# Patient Record
Sex: Female | Born: 1945 | Race: White | Hispanic: No | Marital: Married | Smoking: Former smoker
Health system: Southern US, Community
[De-identification: ages and names within clinical notes are randomized; demographics above are authoritative.]

## PROBLEM LIST (undated history)

## (undated) DIAGNOSIS — I639 Cerebral infarction, unspecified: Secondary | ICD-10-CM

## (undated) DIAGNOSIS — D1800 Hemangioma unspecified site: Secondary | ICD-10-CM

## (undated) DIAGNOSIS — Z87898 Personal history of other specified conditions: Secondary | ICD-10-CM

## (undated) DIAGNOSIS — Z5189 Encounter for other specified aftercare: Secondary | ICD-10-CM

## (undated) DIAGNOSIS — M549 Dorsalgia, unspecified: Secondary | ICD-10-CM

## (undated) DIAGNOSIS — G20 Parkinson's disease: Secondary | ICD-10-CM

## (undated) DIAGNOSIS — R413 Other amnesia: Secondary | ICD-10-CM

## (undated) DIAGNOSIS — Z87440 Personal history of urinary (tract) infections: Secondary | ICD-10-CM

## (undated) DIAGNOSIS — K759 Inflammatory liver disease, unspecified: Secondary | ICD-10-CM

## (undated) DIAGNOSIS — R269 Unspecified abnormalities of gait and mobility: Secondary | ICD-10-CM

## (undated) DIAGNOSIS — I519 Heart disease, unspecified: Secondary | ICD-10-CM

## (undated) DIAGNOSIS — G8929 Other chronic pain: Secondary | ICD-10-CM

## (undated) DIAGNOSIS — M4310 Spondylolisthesis, site unspecified: Secondary | ICD-10-CM

## (undated) DIAGNOSIS — G4730 Sleep apnea, unspecified: Secondary | ICD-10-CM

## (undated) DIAGNOSIS — K219 Gastro-esophageal reflux disease without esophagitis: Secondary | ICD-10-CM

## (undated) DIAGNOSIS — G20A1 Parkinson's disease without dyskinesia, without mention of fluctuations: Secondary | ICD-10-CM

## (undated) DIAGNOSIS — I10 Essential (primary) hypertension: Secondary | ICD-10-CM

## (undated) DIAGNOSIS — F329 Major depressive disorder, single episode, unspecified: Secondary | ICD-10-CM

## (undated) DIAGNOSIS — H9190 Unspecified hearing loss, unspecified ear: Secondary | ICD-10-CM

## (undated) DIAGNOSIS — F32A Depression, unspecified: Secondary | ICD-10-CM

## (undated) DIAGNOSIS — M48061 Spinal stenosis, lumbar region without neurogenic claudication: Secondary | ICD-10-CM

## (undated) DIAGNOSIS — F419 Anxiety disorder, unspecified: Secondary | ICD-10-CM

## (undated) DIAGNOSIS — M109 Gout, unspecified: Secondary | ICD-10-CM

## (undated) DIAGNOSIS — M5416 Radiculopathy, lumbar region: Secondary | ICD-10-CM

## (undated) DIAGNOSIS — G4733 Obstructive sleep apnea (adult) (pediatric): Secondary | ICD-10-CM

## (undated) DIAGNOSIS — N390 Urinary tract infection, site not specified: Secondary | ICD-10-CM

## (undated) HISTORY — DX: Other amnesia: R41.3

## (undated) HISTORY — DX: Depression, unspecified: F32.A

## (undated) HISTORY — DX: Unspecified abnormalities of gait and mobility: R26.9

## (undated) HISTORY — DX: Personal history of urinary (tract) infections: Z87.440

## (undated) HISTORY — PX: APPENDECTOMY: SHX54

## (undated) HISTORY — DX: Dorsalgia, unspecified: M54.9

## (undated) HISTORY — DX: Major depressive disorder, single episode, unspecified: F32.9

## (undated) HISTORY — DX: Parkinson's disease without dyskinesia, without mention of fluctuations: G20.A1

## (undated) HISTORY — DX: Hemangioma unspecified site: D18.00

## (undated) HISTORY — PX: OTHER SURGICAL HISTORY: SHX169

## (undated) HISTORY — DX: Heart disease, unspecified: I51.9

## (undated) HISTORY — DX: Spinal stenosis, lumbar region without neurogenic claudication: M48.061

## (undated) HISTORY — DX: Personal history of other specified conditions: Z87.898

## (undated) HISTORY — DX: Radiculopathy, lumbar region: M54.16

## (undated) HISTORY — PX: ABDOMINAL HYSTERECTOMY: SHX81

## (undated) HISTORY — DX: Gout, unspecified: M10.9

## (undated) HISTORY — DX: Sleep apnea, unspecified: G47.30

## (undated) HISTORY — DX: Essential (primary) hypertension: I10

## (undated) HISTORY — DX: Parkinson's disease: G20

## (undated) HISTORY — PX: FOOT SURGERY: SHX648

## (undated) HISTORY — DX: Unspecified hearing loss, unspecified ear: H91.90

## (undated) HISTORY — DX: Spondylolisthesis, site unspecified: M43.10

## (undated) HISTORY — PX: BACK SURGERY: SHX140

## (undated) HISTORY — DX: Obstructive sleep apnea (adult) (pediatric): G47.33

## (undated) HISTORY — DX: Cerebral infarction, unspecified: I63.9

## (undated) HISTORY — DX: Other chronic pain: G89.29

## (undated) HISTORY — PX: LAMINECTOMY: SHX219

---

## 1971-05-28 DIAGNOSIS — K759 Inflammatory liver disease, unspecified: Secondary | ICD-10-CM

## 1971-05-28 HISTORY — DX: Inflammatory liver disease, unspecified: K75.9

## 1974-05-27 DIAGNOSIS — IMO0001 Reserved for inherently not codable concepts without codable children: Secondary | ICD-10-CM

## 1974-05-27 DIAGNOSIS — Z5189 Encounter for other specified aftercare: Secondary | ICD-10-CM

## 1974-05-27 HISTORY — DX: Reserved for inherently not codable concepts without codable children: IMO0001

## 1974-05-27 HISTORY — DX: Encounter for other specified aftercare: Z51.89

## 1997-12-28 ENCOUNTER — Other Ambulatory Visit: Admission: RE | Admit: 1997-12-28 | Discharge: 1997-12-28 | Payer: Self-pay | Admitting: Family Medicine

## 1998-02-13 ENCOUNTER — Inpatient Hospital Stay (HOSPITAL_COMMUNITY): Admission: EM | Admit: 1998-02-13 | Discharge: 1998-02-14 | Payer: Self-pay | Admitting: Emergency Medicine

## 1998-06-18 ENCOUNTER — Emergency Department (HOSPITAL_COMMUNITY): Admission: EM | Admit: 1998-06-18 | Discharge: 1998-06-18 | Payer: Self-pay | Admitting: *Deleted

## 1998-06-18 ENCOUNTER — Encounter: Payer: Self-pay | Admitting: *Deleted

## 1999-04-27 ENCOUNTER — Encounter: Admission: RE | Admit: 1999-04-27 | Discharge: 1999-04-27 | Payer: Self-pay

## 2001-05-27 HISTORY — PX: CARDIAC CATHETERIZATION: SHX172

## 2002-04-27 ENCOUNTER — Inpatient Hospital Stay (HOSPITAL_COMMUNITY): Admission: EM | Admit: 2002-04-27 | Discharge: 2002-04-30 | Payer: Self-pay | Admitting: Emergency Medicine

## 2002-04-27 ENCOUNTER — Encounter: Payer: Self-pay | Admitting: Cardiovascular Disease

## 2002-04-28 ENCOUNTER — Encounter: Payer: Self-pay | Admitting: Cardiovascular Disease

## 2005-07-18 ENCOUNTER — Encounter: Admission: RE | Admit: 2005-07-18 | Discharge: 2005-07-18 | Payer: Self-pay | Admitting: Neurology

## 2005-08-01 ENCOUNTER — Encounter: Admission: RE | Admit: 2005-08-01 | Discharge: 2005-08-01 | Payer: Self-pay | Admitting: Neurology

## 2005-08-23 ENCOUNTER — Encounter: Admission: RE | Admit: 2005-08-23 | Discharge: 2005-08-23 | Payer: Self-pay | Admitting: Neurology

## 2005-09-25 ENCOUNTER — Encounter: Payer: Self-pay | Admitting: Cardiology

## 2006-07-10 ENCOUNTER — Encounter: Admission: RE | Admit: 2006-07-10 | Discharge: 2006-07-10 | Payer: Self-pay | Admitting: Neurology

## 2007-04-06 ENCOUNTER — Encounter: Admission: RE | Admit: 2007-04-06 | Discharge: 2007-04-06 | Payer: Self-pay | Admitting: Neurology

## 2007-04-28 ENCOUNTER — Encounter: Admission: RE | Admit: 2007-04-28 | Discharge: 2007-04-28 | Payer: Self-pay | Admitting: Neurology

## 2007-05-08 ENCOUNTER — Encounter: Admission: RE | Admit: 2007-05-08 | Discharge: 2007-05-08 | Payer: Self-pay | Admitting: Neurology

## 2007-12-12 ENCOUNTER — Encounter: Admission: RE | Admit: 2007-12-12 | Discharge: 2007-12-12 | Payer: Self-pay | Admitting: Neurology

## 2008-03-28 ENCOUNTER — Inpatient Hospital Stay (HOSPITAL_COMMUNITY): Admission: RE | Admit: 2008-03-28 | Discharge: 2008-04-01 | Payer: Self-pay | Admitting: Neurosurgery

## 2008-03-31 ENCOUNTER — Ambulatory Visit: Payer: Self-pay | Admitting: Physical Medicine & Rehabilitation

## 2008-04-01 ENCOUNTER — Inpatient Hospital Stay (HOSPITAL_COMMUNITY)
Admission: RE | Admit: 2008-04-01 | Discharge: 2008-04-05 | Payer: Self-pay | Admitting: Physical Medicine & Rehabilitation

## 2009-07-30 IMAGING — CR DG HIP COMPLETE 2+V*R*
2 series · 2 of 2 positions shown · non-contrast
Comparison: none

CLINICAL DATA: Pain in both hips left greater than right.  No injury.
 LEFT HIP ? 2 VIEW:

[t hip ap right *]
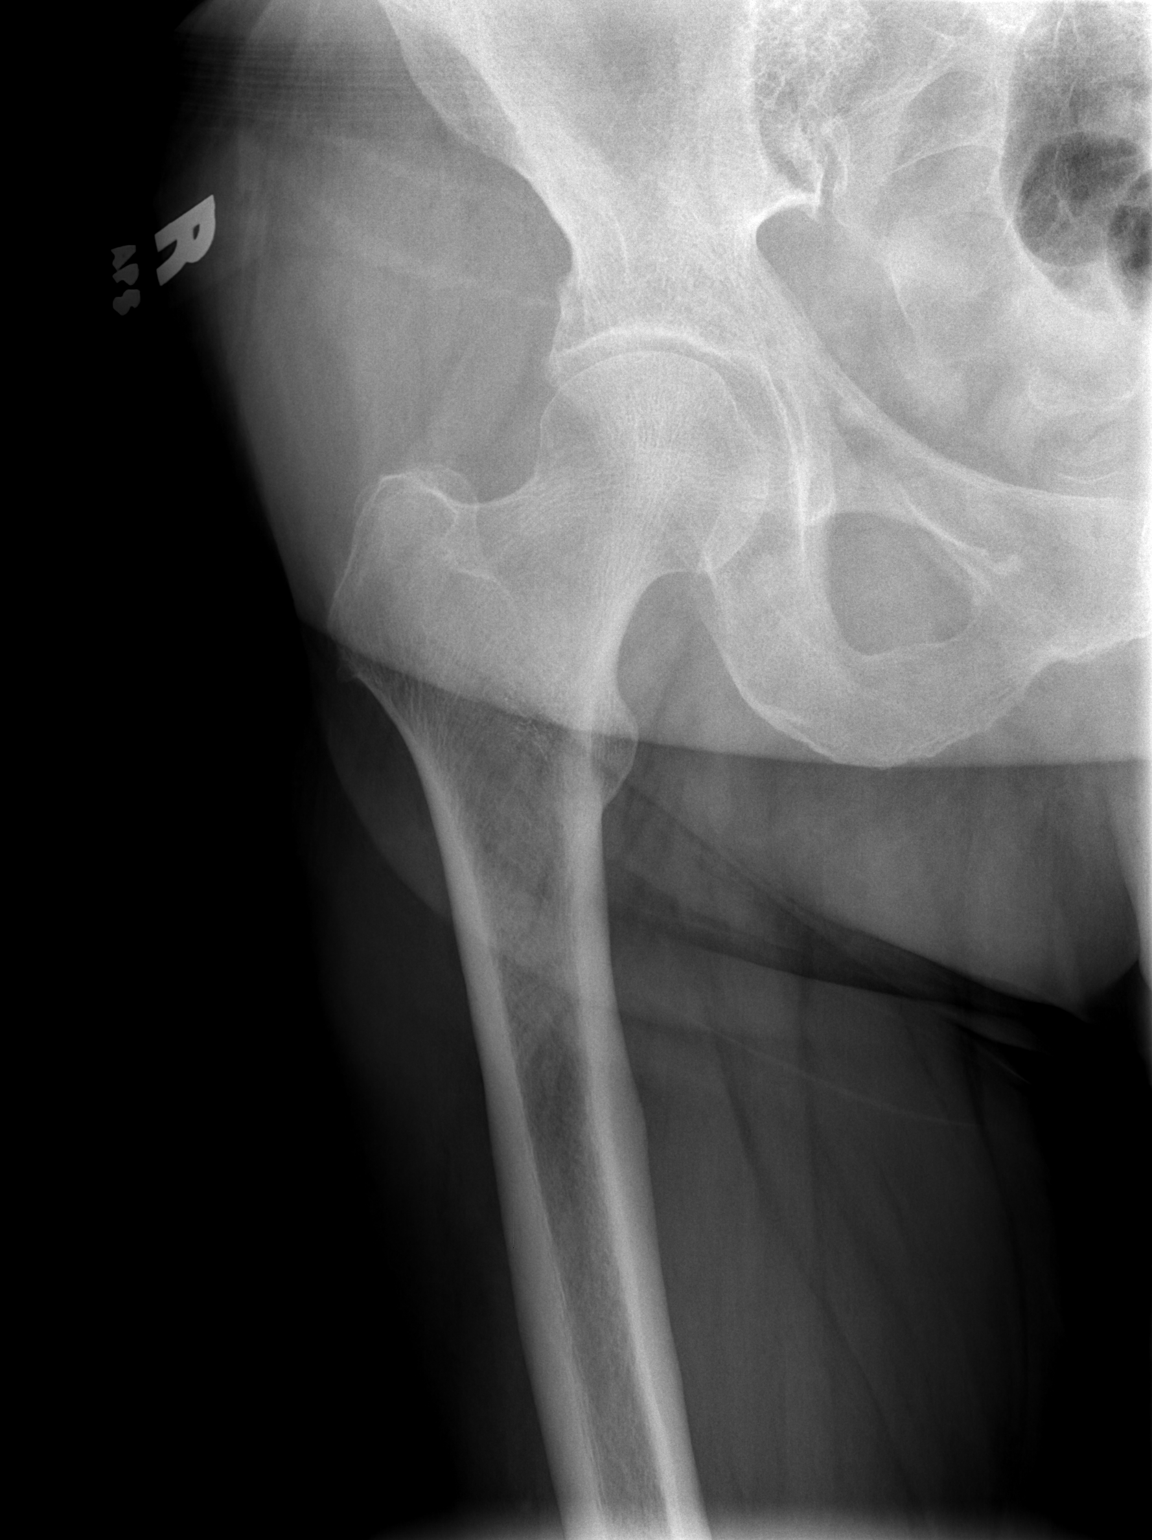

[t hip frog leg right *]
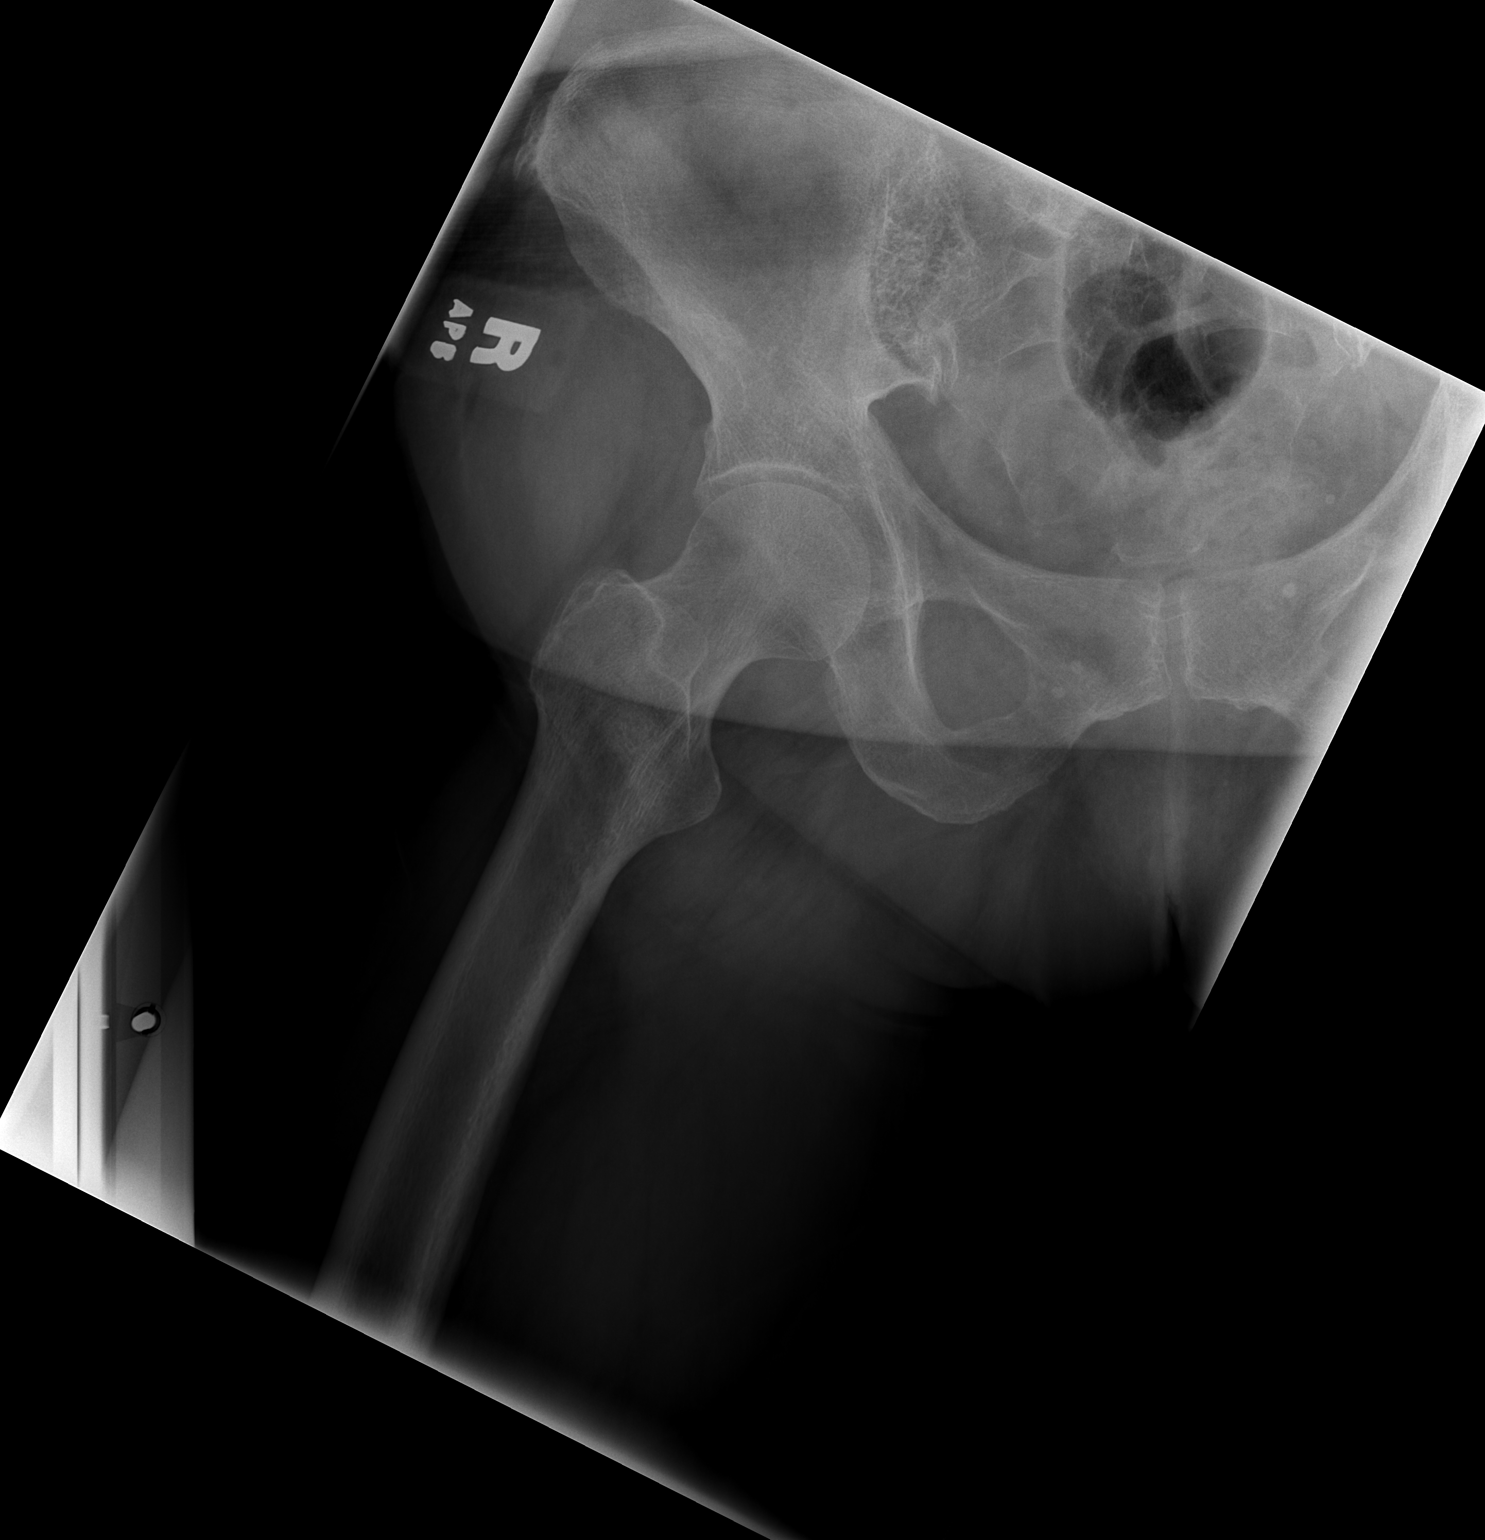

[2 of 2 positions shown; findings below may reference images not displayed]

FINDINGS: Two views of the left hip were obtained.  No significant degenerative change is seen.  No acute bony abnormality is noted.  Left ramus is intact and the SI joint appears normal.
IMPRESSION: Negative left hip. 
 RIGHT HIP ? 2 VIEW:
FINDINGS: Two views of the right hip show no significant degenerative change.  No acute abnormality is seen.  The ramus is intact.  There is a trabeculated appearance to the right hemipelvis adjacent to the SI joints.  This may simply represent degenerative process but a pelvis film is recommended to evaluate further.
IMPRESSION: No abnormality of the right hip.  There is a prominent trabecular pattern in the medial right pelvis as noted above.  Recommend pelvis film.

## 2009-07-30 IMAGING — CR DG HIP (WITH OR WITHOUT PELVIS) 2-3V*L*
2 series · 2 of 2 positions shown · non-contrast
Comparison: none

CLINICAL DATA: Pain in both hips left greater than right.  No injury.
 LEFT HIP ? 2 VIEW:

[t hip ap left]
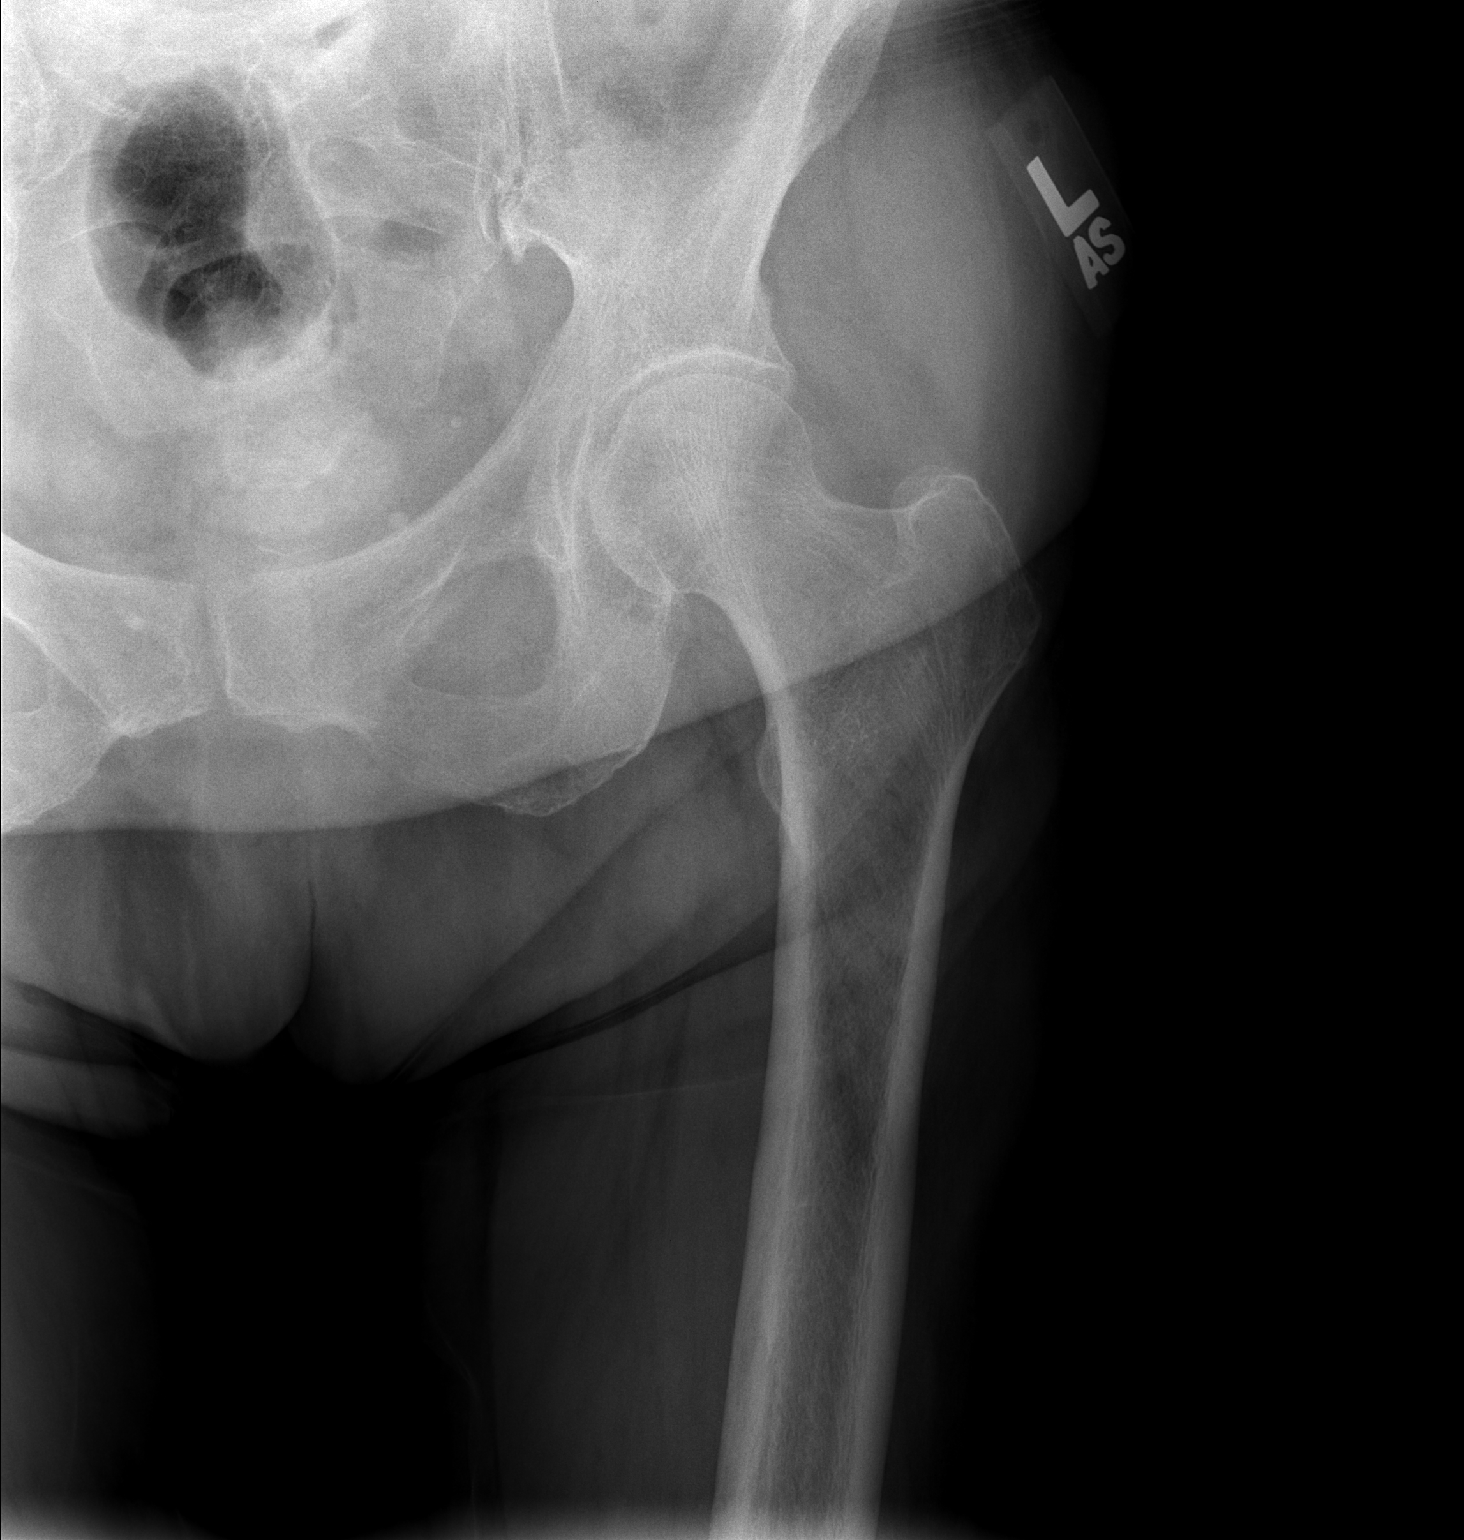

[t hip frog leg left *]
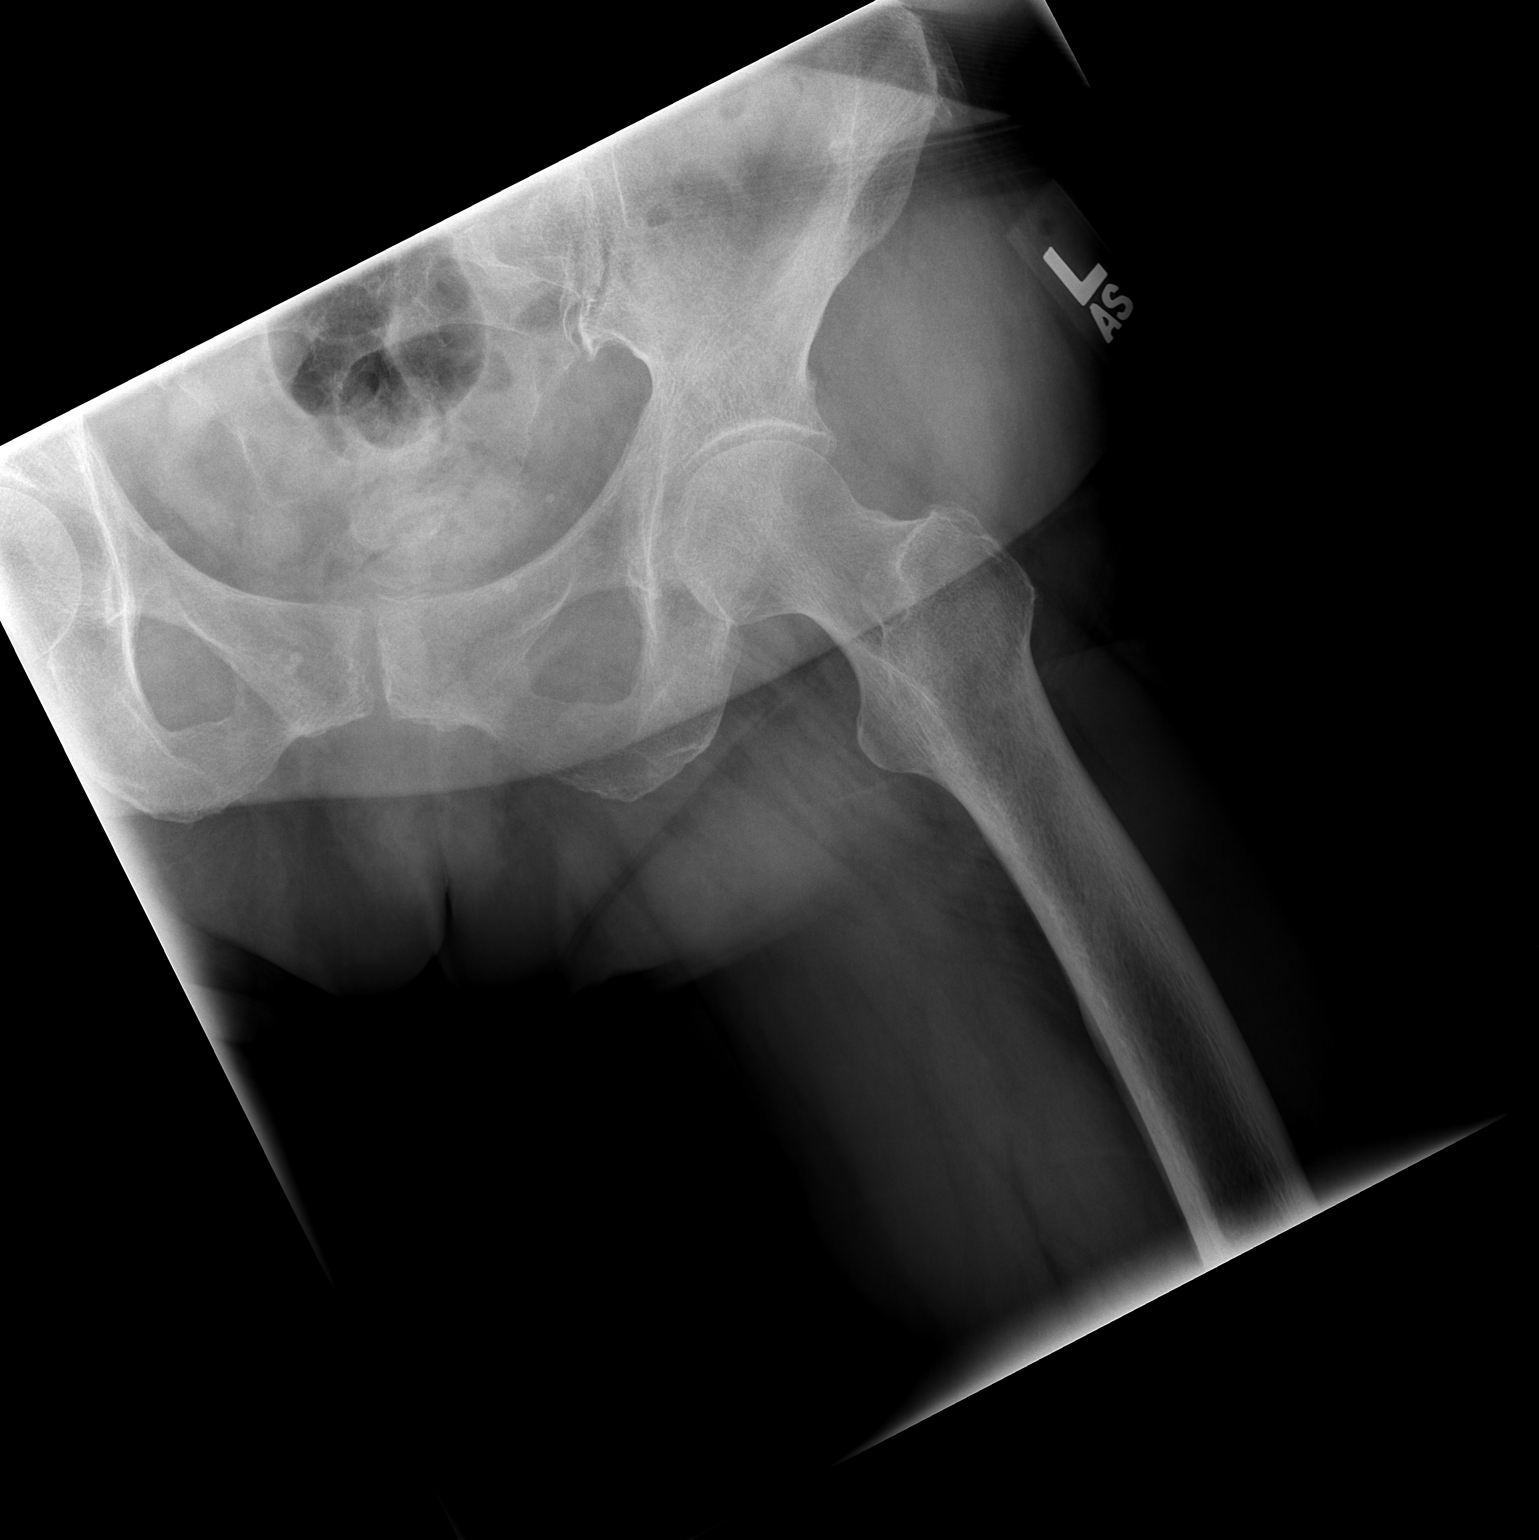

[2 of 2 positions shown; findings below may reference images not displayed]

FINDINGS: Two views of the left hip were obtained.  No significant degenerative change is seen.  No acute bony abnormality is noted.  Left ramus is intact and the SI joint appears normal.
IMPRESSION: Negative left hip. 
 RIGHT HIP ? 2 VIEW:
FINDINGS: Two views of the right hip show no significant degenerative change.  No acute abnormality is seen.  The ramus is intact.  There is a trabeculated appearance to the right hemipelvis adjacent to the SI joints.  This may simply represent degenerative process but a pelvis film is recommended to evaluate further.
IMPRESSION: No abnormality of the right hip.  There is a prominent trabecular pattern in the medial right pelvis as noted above.  Recommend pelvis film.

## 2010-05-02 ENCOUNTER — Encounter
Admission: RE | Admit: 2010-05-02 | Discharge: 2010-05-02 | Payer: Self-pay | Source: Home / Self Care | Admitting: Neurology

## 2010-05-18 ENCOUNTER — Encounter
Admission: RE | Admit: 2010-05-18 | Discharge: 2010-05-18 | Payer: Self-pay | Source: Home / Self Care | Attending: Neurology | Admitting: Neurology

## 2010-06-29 ENCOUNTER — Other Ambulatory Visit (HOSPITAL_COMMUNITY): Payer: Self-pay | Admitting: Neurology

## 2010-06-29 DIAGNOSIS — M545 Low back pain: Secondary | ICD-10-CM

## 2010-06-29 DIAGNOSIS — M47817 Spondylosis without myelopathy or radiculopathy, lumbosacral region: Secondary | ICD-10-CM

## 2010-07-12 ENCOUNTER — Ambulatory Visit (HOSPITAL_COMMUNITY)
Admission: RE | Admit: 2010-07-12 | Discharge: 2010-07-12 | Disposition: A | Discharge status: Home / Self Care | Payer: PRIVATE HEALTH INSURANCE | Source: Ambulatory Visit | Attending: Neurology | Admitting: Neurology

## 2010-07-12 DIAGNOSIS — G20 Parkinson's disease: Secondary | ICD-10-CM | POA: Insufficient documentation

## 2010-07-12 DIAGNOSIS — M48061 Spinal stenosis, lumbar region without neurogenic claudication: Secondary | ICD-10-CM | POA: Insufficient documentation

## 2010-07-12 DIAGNOSIS — M47817 Spondylosis without myelopathy or radiculopathy, lumbosacral region: Secondary | ICD-10-CM | POA: Insufficient documentation

## 2010-07-12 DIAGNOSIS — M545 Low back pain, unspecified: Secondary | ICD-10-CM | POA: Insufficient documentation

## 2010-07-12 DIAGNOSIS — Z981 Arthrodesis status: Secondary | ICD-10-CM | POA: Insufficient documentation

## 2010-07-12 DIAGNOSIS — G20A1 Parkinson's disease without dyskinesia, without mention of fluctuations: Secondary | ICD-10-CM | POA: Insufficient documentation

## 2010-08-14 ENCOUNTER — Ambulatory Visit (HOSPITAL_COMMUNITY)
Admission: RE | Admit: 2010-08-14 | Discharge: 2010-08-14 | Disposition: A | Discharge status: Home / Self Care | Payer: PRIVATE HEALTH INSURANCE | Source: Ambulatory Visit | Attending: Neurosurgery | Admitting: Neurosurgery

## 2010-08-14 ENCOUNTER — Encounter (HOSPITAL_COMMUNITY)
Admission: RE | Admit: 2010-08-14 | Discharge: 2010-08-14 | Disposition: A | Discharge status: Home / Self Care | Payer: PRIVATE HEALTH INSURANCE | Source: Ambulatory Visit | Attending: Neurosurgery | Admitting: Neurosurgery

## 2010-08-14 ENCOUNTER — Other Ambulatory Visit (HOSPITAL_COMMUNITY): Payer: Self-pay | Admitting: Neurosurgery

## 2010-08-14 DIAGNOSIS — Z01818 Encounter for other preprocedural examination: Secondary | ICD-10-CM | POA: Insufficient documentation

## 2010-08-14 DIAGNOSIS — Z01811 Encounter for preprocedural respiratory examination: Secondary | ICD-10-CM

## 2010-08-14 DIAGNOSIS — Z01810 Encounter for preprocedural cardiovascular examination: Secondary | ICD-10-CM | POA: Insufficient documentation

## 2010-08-14 DIAGNOSIS — Z01812 Encounter for preprocedural laboratory examination: Secondary | ICD-10-CM | POA: Insufficient documentation

## 2010-08-14 LAB — CBC
HCT: 37.3 % (ref 36.0–46.0)
MCH: 33.8 pg (ref 26.0–34.0)
MCV: 94.9 fL (ref 78.0–100.0)
Platelets: 213 10*3/uL (ref 150–400)
WBC: 7.8 10*3/uL (ref 4.0–10.5)

## 2010-08-14 LAB — COMPREHENSIVE METABOLIC PANEL
ALT: <8 U/L (ref 0–35)
Alkaline Phosphatase: 61 U/L (ref 39–117)
CO2: 31 mEq/L (ref 19–32)
Calcium: 9.5 mg/dL (ref 8.4–10.5)
GFR calc non Af Amer: >60 mL/min (ref >60)
Potassium: 4.2 mEq/L (ref 3.5–5.1)
Sodium: 142 mEq/L (ref 135–145)

## 2010-08-20 ENCOUNTER — Encounter: Payer: Self-pay | Admitting: Cardiovascular Disease

## 2010-08-21 ENCOUNTER — Encounter: Payer: Self-pay | Admitting: Cardiology

## 2010-08-21 ENCOUNTER — Telehealth: Payer: Self-pay | Admitting: Cardiology

## 2010-08-21 ENCOUNTER — Ambulatory Visit (INDEPENDENT_AMBULATORY_CARE_PROVIDER_SITE_OTHER): Payer: PRIVATE HEALTH INSURANCE | Admitting: Cardiology

## 2010-08-21 DIAGNOSIS — I10 Essential (primary) hypertension: Secondary | ICD-10-CM

## 2010-08-21 DIAGNOSIS — Z01810 Encounter for preprocedural cardiovascular examination: Secondary | ICD-10-CM

## 2010-08-21 DIAGNOSIS — E119 Type 2 diabetes mellitus without complications: Secondary | ICD-10-CM | POA: Insufficient documentation

## 2010-08-21 NOTE — Assessment & Plan Note (Signed)
Risk factors including diet-controlled diabetes, hypertension. Also with atypical chest pain and an abnormal electrocardiogram. Proceed with Myoview he. Continue beta blocker pre-and postoperatively.

## 2010-08-21 NOTE — Telephone Encounter (Signed)
Faxed OV & EKG to [place] [facility] ([phone]).

## 2010-08-21 NOTE — Progress Notes (Signed)
HPI: 65 year old female for preoperative evaluation prior to lumbar fusion. Cardiac catheterization in 2003 showed normal coronary arteries and normal LV function. Patient has limited mobility related to back problems. She has mild dyspnea on exertion but no orthopnea, PND, pedal edema, palpitations, syncope. She occasionally feels substernal chest pain when she is stressed. It does not radiate. There is no associated symptoms. It lasts seconds to minutes and resolved spontaneously. It is not related to exertion.  Current Outpatient Prescriptions  Medication Sig Dispense Refill  . ALPRAZolam (XANAX) 1 MG tablet Take 1 mg by mouth as needed.        Marland Kitchen amLODipine (NORVASC) 10 MG tablet Take 10 mg by mouth daily.        Marland Kitchen atenolol (TENORMIN) 100 MG tablet Take 100 mg by mouth daily.        . carbidopa-levodopa-entacapone (STALEVO) 37.5-150-200 MG per tablet Take 1 tablet by mouth 4 (four) times daily.        . furosemide (LASIX) 20 MG tablet Take 20 mg by mouth daily.        Marland Kitchen HYDROcodone-acetaminophen (NORCO) 5-325 MG per tablet Take 1 tablet by mouth every 6 (six) hours as needed.        . Potassium (POTASSIMIN PO) 1 tab po qd       . venlafaxine (EFFEXOR) 75 MG tablet Take 150 mg by mouth daily.       . cloNIDine (CATAPRES) 0.3 MG tablet Take 0.3 mg by mouth 2 (two) times daily.        Marland Kitchen gabapentin (NEURONTIN) 800 MG tablet Take 800 mg by mouth 3 (three) times daily.        Marland Kitchen losartan (COZAAR) 100 MG tablet Take 100 mg by mouth daily.          Allergies  Allergen Reactions  . Estrogens     Past Medical History  Diagnosis Date  . Spondylolisthesis   . Lumbar stenosis   . Lumbar radiculopathy   . Diabetes mellitus     Diet controlled  . Depression   . Sleep apnea   . Parkinson disease   . CVA (cerebrovascular accident)   . Asthma   . Hemangioma   . Hypertension     Past Surgical History  Procedure Date  . Laminectomy   . Cardiac catheterization 2003     Angiographically patent  coronary arteries with luminal irregularities in mid left anterior descending coronary artery and mid right coronary  artery. Normal left ventricular systolic function, ejection fraction estimate of  60 to 65% Normal abdominal aorta.   Normal renal arteries.  . Abdominal hysterectomy   . Foot surgery   . Anal fissure surgery   . Appendectomy     History   Social History  . Marital Status: Married    Spouse Name: N/A    Number of Children: N/A  . Years of Education: N/A   Occupational History  . Not on file.   Social History Main Topics  . Smoking status: Never Smoker   . Smokeless tobacco: Not on file  . Alcohol Use: No  . Drug Use: No  . Sexually Active: Not on file   Other Topics Concern  . Not on file   Social History Narrative   The patient is married, independent, but sedentary  prior to admission.  Lives in one level home with 5-6 at entry.  Does  not use any tobacco or alcohol.  Husband works; however, does most of  the  household.  The patient's family and husband to provide assist past  discharge.     Family History  Problem Relation Age of Onset  . Coronary artery disease      ROS: no fevers or chills, productive cough, hemoptysis, dysphasia, odynophagia, melena, hematochezia, dysuria, hematuria, rash, seizure activity, orthopnea, PND, pedal edema, claudication. Remaining systems are negative.  Physical Exam: General:  Well developed/well nourished in NAD Skin warm/dry Patient not depressed No peripheral clubbing Back-normal HEENT-normal/normal eyelids Neck supple/normal carotid upstroke bilaterally; no bruits; no JVD; no thyromegaly chest - CTA/ normal expansion CV - RRR/normal S1 and S2; no murmurs, rubs or gallops;  PMI nondisplaced Abdomen -NT/ND, no HSM, no mass, + bowel sounds, no bruit 2+ femoral pulses, no bruits Ext-no edema, chords, 2+ DP Neuro-grossly nonfocal  ECG Sinus bradycardia at a rate of 55. Left ventricular hypertrophy. Repolarization  abnormality. Cannot rule out prior septal infarct.

## 2010-08-21 NOTE — Patient Instructions (Signed)
Your physician has requested that you have a lexiscan myoview. For further information please visit [email or web address]. Please follow instruction sheet, as given.   

## 2010-08-21 NOTE — Assessment & Plan Note (Signed)
Continue present blood pressure meds.

## 2010-08-21 NOTE — Assessment & Plan Note (Signed)
Management per primary care. 

## 2010-08-22 ENCOUNTER — Inpatient Hospital Stay (HOSPITAL_COMMUNITY): Admission: RE | Admit: 2010-08-22 | Payer: PRIVATE HEALTH INSURANCE | Source: Ambulatory Visit | Admitting: Neurosurgery

## 2010-08-22 NOTE — Progress Notes (Signed)
No precert required per Joni Reining S.at [facility]

## 2010-08-29 ENCOUNTER — Ambulatory Visit (HOSPITAL_COMMUNITY): Payer: PRIVATE HEALTH INSURANCE | Attending: Cardiology | Admitting: Radiology

## 2010-08-29 DIAGNOSIS — R0609 Other forms of dyspnea: Secondary | ICD-10-CM

## 2010-08-29 DIAGNOSIS — R0989 Other specified symptoms and signs involving the circulatory and respiratory systems: Secondary | ICD-10-CM

## 2010-08-29 DIAGNOSIS — R079 Chest pain, unspecified: Secondary | ICD-10-CM

## 2010-08-29 DIAGNOSIS — Z01810 Encounter for preprocedural cardiovascular examination: Secondary | ICD-10-CM | POA: Insufficient documentation

## 2010-08-29 DIAGNOSIS — R9431 Abnormal electrocardiogram [ECG] [EKG]: Secondary | ICD-10-CM

## 2010-08-29 MED ORDER — REGADENOSON 0.4 MG/5ML IV SOLN
0.4000 mg | Freq: Once | INTRAVENOUS | Status: AC
  Administered 2010-08-29: 0.4 mg via INTRAVENOUS

## 2010-08-29 MED ORDER — TECHNETIUM TC 99M TETROFOSMIN IV KIT
11.0000 | PACK | Freq: Once | INTRAVENOUS | Status: AC | PRN
  Administered 2010-08-29: 11 via INTRAVENOUS

## 2010-08-29 MED ORDER — TECHNETIUM TC 99M TETROFOSMIN IV KIT
33.0000 | PACK | Freq: Once | INTRAVENOUS | Status: AC | PRN
  Administered 2010-08-29: 33 via INTRAVENOUS

## 2010-08-29 NOTE — Progress Notes (Signed)
[facility] SITE 3 NUCLEAR MED [address] [place] [address] [phone]  Cardiology Nuclear Med Study  [name] is a 65 y.o. female [id number] May 26, 1946   Nuclear Med Background Indication for Stress Test:  Evaluation for Ischemia, Surgical Clearance pending lumbar fusion by Dr. Tressie Stalker, and Abnormal EKG History:  '03 MPS:OK per patient, '03 Cath:normal Cardiac Risk Factors: CVA, Family History - CAD, Hypertension and NIDDM-diet controlled  Symptoms:  Chest Pain with and without Exertion (last date of chest discomfort was about 8-months ago), DOE and Palpitations   Nuclear Pre-Procedure Caffeine/Decaff Intake:  None NPO After: 8:00pm   Lungs:  Clear.  O2 sat 98% on RA IV 0.9% NS with Angio Cath:  20g  IV Site: R Antecubital  IV Started by:  Irean Hong, RN  Chest Size (in):  44 Cup Size: C  Height: 5' (1.524 m)  Weight:  115 lb (52.164 kg)  BMI:  Body mass index is 22.46 kg/(m^2). Tech Comments:  Held Atenolol this am    Nuclear Med Study 1 or 2 day study: 1 day  Stress Test Type:  Lexiscan  Reading MD: Willa Rough, MD  Order Authorizing Provider:  Olga Millers, MD  Resting Radionuclide: Technetium 75m Tetrofosmin  Resting Radionuclide Dose: 11 mCi   Stress Radionuclide:  Technetium 59m Tetrofosmin  Stress Radionuclide Dose: 33 mCi           Stress Protocol Rest HR: 52 Stress HR: 79  Rest BP: 131/87 Stress BP: 149/87  Exercise Time (min): n/a METS: n/a   Predicted Max HR: 157 bpm % Max HR: 50.32 bpm Rate Pressure Product: 21308   Dose of Adenosine (mg):  n/a Dose of Lexiscan: 0.4 mg  Dose of Atropine (mg): n/a Dose of Dobutamine: n/a mcg/kg/min (at max HR)  Stress Test Technologist: Rea College, CMA-N  Nuclear Technologist:  Domenic Polite, CNMT     Rest Procedure:  Myocardial perfusion imaging was performed at rest 45 minutes following the intravenous administration of Technetium 3m Tetrofosmin. Rest ECG:   Nonspecific T-wave changes and ? prior [facility]  Stress Procedure:  The patient received IV Lexiscan 0.4 mg over 15-seconds.  Technetium 48m Tetrofosmin injected at 30-seconds.  There were no significant changes with Lexiscan; isolated PVC.  Quantitative spect images were obtained after a 45 minute delay. Stress ECG: No significant change from baseline ECG  QPS Raw Data Images:  Patient motion noted; appropriate software correction applied. Stress Images:  Normal homogeneous uptake in all areas of the myocardium. Rest Images:  Normal homogeneous uptake in all areas of the myocardium. Subtraction (SDS):  No evidence of ischemia. Transient Ischemic Dilatation (Normal <1.22):  1.08 Lung/Heart Ratio (Normal <0.45):  .26  Quantitative Gated Spect Images QGS EDV:  82 ml QGS ESV:  24 ml QGS cine images:  NL LV Function; NL Wall Motion QGS EF: 70%  Impression Exercise Capacity:  Lexiscan with no exercise. BP Response:  Normal blood pressure response. Clinical Symptoms:  SOB ECG Impression:  No significant ST segment change suggestive of ischemia. Comparison with Prior Nuclear Study: No images to compare  Overall Impression:  Normal stress nuclear study.    Willa Rough

## 2010-08-30 ENCOUNTER — Encounter: Payer: Self-pay | Admitting: Cardiology

## 2010-08-30 NOTE — Progress Notes (Signed)
COPY ROUTED TO DR.CRENSHAW.Falecha Clark ° °

## 2010-09-05 ENCOUNTER — Encounter: Payer: Self-pay | Admitting: *Deleted

## 2010-09-05 ENCOUNTER — Telehealth: Payer: Self-pay | Admitting: Cardiology

## 2010-09-05 NOTE — Telephone Encounter (Signed)
pt aware of results [name]  

## 2010-09-05 NOTE — Progress Notes (Signed)
Pt aware of results. clearence will be faxed to dr Tinnie Gens Melvyn Neth

## 2010-09-25 ENCOUNTER — Telehealth: Payer: Self-pay | Admitting: Cardiology

## 2010-09-25 ENCOUNTER — Encounter (HOSPITAL_COMMUNITY)
Admission: RE | Admit: 2010-09-25 | Discharge: 2010-09-25 | Disposition: A | Discharge status: Home / Self Care | Payer: PRIVATE HEALTH INSURANCE | Source: Ambulatory Visit | Attending: Neurosurgery | Admitting: Neurosurgery

## 2010-09-25 LAB — TYPE AND SCREEN
ABO/RH(D): O POS
Antibody Screen: NEGATIVE

## 2010-09-25 LAB — CBC
MCV: 93.9 fL (ref 78.0–100.0)
Platelets: 177 10*3/uL (ref 150–400)
RBC: 3.78 MIL/uL — ABNORMAL LOW (ref 3.87–5.11)
RDW: 12.1 % (ref 11.5–15.5)
WBC: 6.7 10*3/uL (ref 4.0–10.5)

## 2010-09-25 LAB — HEPATIC FUNCTION PANEL
ALT: 12 U/L (ref 0–35)
AST: 16 U/L (ref 0–37)
Albumin: 3.7 g/dL (ref 3.5–5.2)
Bilirubin, Direct: 0.1 mg/dL (ref 0.0–0.3)
Total Protein: 5.8 g/dL — ABNORMAL LOW (ref 6.0–8.3)

## 2010-09-25 LAB — BASIC METABOLIC PANEL
Calcium: 9.2 mg/dL (ref 8.4–10.5)
GFR calc Af Amer: >60 mL/min (ref >60)
GFR calc non Af Amer: >60 mL/min (ref >60)
Glucose, Bld: 97 mg/dL (ref 70–99)
Potassium: 4.5 mEq/L (ref 3.5–5.1)
Sodium: 140 mEq/L (ref 135–145)

## 2010-09-25 LAB — SURGICAL PCR SCREEN: MRSA, PCR: NEGATIVE

## 2010-09-25 NOTE — Telephone Encounter (Signed)
All Cardiac faxed to [facility] @ [phone]  09/25/10/km

## 2010-10-01 ENCOUNTER — Inpatient Hospital Stay (HOSPITAL_COMMUNITY): Payer: PRIVATE HEALTH INSURANCE

## 2010-10-01 ENCOUNTER — Inpatient Hospital Stay (HOSPITAL_COMMUNITY)
Admission: RE | Admit: 2010-10-01 | Discharge: 2010-10-03 | DRG: 460 | Disposition: A | Discharge status: Home / Self Care | Payer: PRIVATE HEALTH INSURANCE | Source: Ambulatory Visit | Attending: Neurosurgery | Admitting: Neurosurgery

## 2010-10-01 DIAGNOSIS — M48061 Spinal stenosis, lumbar region without neurogenic claudication: Principal | ICD-10-CM | POA: Diagnosis present

## 2010-10-01 DIAGNOSIS — Z981 Arthrodesis status: Secondary | ICD-10-CM

## 2010-10-01 DIAGNOSIS — Z01812 Encounter for preprocedural laboratory examination: Secondary | ICD-10-CM

## 2010-10-01 DIAGNOSIS — I10 Essential (primary) hypertension: Secondary | ICD-10-CM | POA: Diagnosis present

## 2010-10-01 DIAGNOSIS — E119 Type 2 diabetes mellitus without complications: Secondary | ICD-10-CM | POA: Diagnosis present

## 2010-10-01 DIAGNOSIS — Z87891 Personal history of nicotine dependence: Secondary | ICD-10-CM

## 2010-10-01 DIAGNOSIS — Q762 Congenital spondylolisthesis: Secondary | ICD-10-CM

## 2010-10-01 DIAGNOSIS — G4733 Obstructive sleep apnea (adult) (pediatric): Secondary | ICD-10-CM | POA: Diagnosis present

## 2010-10-02 LAB — CBC
HCT: 28.9 % — ABNORMAL LOW (ref 36.0–46.0)
Hemoglobin: 10.3 g/dL — ABNORMAL LOW (ref 12.0–15.0)
MCV: 95.4 fL (ref 78.0–100.0)
RBC: 3.03 MIL/uL — ABNORMAL LOW (ref 3.87–5.11)
RDW: 12 % (ref 11.5–15.5)
WBC: 11.4 10*3/uL — ABNORMAL HIGH (ref 4.0–10.5)

## 2010-10-02 LAB — BASIC METABOLIC PANEL
BUN: 18 mg/dL (ref 6–23)
Chloride: 102 mEq/L (ref 96–112)
GFR calc non Af Amer: >60 mL/min (ref >60)
Glucose, Bld: 105 mg/dL — ABNORMAL HIGH (ref 70–99)
Potassium: 3.9 mEq/L (ref 3.5–5.1)
Sodium: 135 mEq/L (ref 135–145)

## 2010-10-09 NOTE — H&P (Signed)
NAMEEMILEY, DIGIACOMO NO.:  [id number]   MEDICAL RECORD NO.:  [id number]          PATIENT TYPE:  IPS   LOCATION:  4036                         FACILITY:  [facility]   PHYSICIAN:  Ranelle Oyster, M.D.DATE OF BIRTH:  [birth year]   DATE OF ADMISSION:  04/01/2008  DATE OF DISCHARGE:                              HISTORY & PHYSICAL   PRIMARY PHYSICIAN:  Dr. Helmut Muster.   NEUROSURGEON:  Cristi Loron, M.D.   CHIEF COMPLAINT:  Back and leg pain.   HISTORY OF PRESENT ILLNESS:  This is a 65 year old white female with  hypertension and Parkinson disease with back problems for years, failing  conservative measures.  The patient with increased pain recently with  radiculopathy in both legs secondary to L4-L5 and L5-S1 degeneration  with stenosis and spondylolisthesis.  The patient also with facet  arthropathy.  The patient elected to go for L4 through S1 PLIF by Dr.  Lovell Sheehan on March 28, 2008.  Postoperative course complicated by  lethargy and urinary retention requiring I&O caths.  The patient has had  also problems with pain control.  She needed encouragement getting out  of bed and cues for mobility.  The patient continued to struggle with  overall mobility and self-care.  Rehab was consulted who felt that she  could benefit from an inpatient admission.   REVIEW OF SYSTEMS:  Notable for anxiety, depression, weakness, low back  pain, urinary retention, diarrhea x2 episodes, chest pain, cough,  decreased hearing, dysphagia, and anorexia.  Full review is in the  written H&P.   PAST MEDICAL HISTORY:  Positive for type 2 diabetes diet controlled,  Parkinson disease, stroke, hypertension, depression/anxiety, chronic  pain, gout, migraine headaches, cardiomyopathy with history of angina,  asthma, obstructive sleep apnea, and hemangioma in the right ilium.   FAMILY HISTORY:  Positive for CAD and cancer.   SOCIAL HISTORY:  The patient is married, independent, but  sedentary  prior to arrival.  She has a one-level house with 5 steps to enter.  She  does not smoke or drink.   FUNCTIONAL HISTORY:  The patient is independent and still driving prior  to arrival.  Upon rehab evaluation 2-3 days ago, the patient was mod  assist for basic mobility and transfers.  As of today, she is min assist  transfer and mod assist ambulating 40 feet with a rolling walker.   ALLERGIES:  MORPHINE and ESTROGEN.   HOME MEDICATIONS:  Effexor, Cardura, Neurontin, lidocaine,  nitroglycerin, Cozaar, Naprosyn, Percocet, Nasonex, albuterol,  colchicine, atenolol, Xanax, and Stalevo 100 mg t.i.d.   LABS:  Hemoglobin 13.9, white count 6.9, and platelets 160,000.  Sodium  138, potassium 3.6, BUN and creatinine 21 and 1.18.   PHYSICAL EXAMINATION:  GENERAL:  The patient is pleasant, sitting in  bed, alert and oriented x3.  HEENT:  Pupils equally round and reactive to light.  Ears, nose, and  throat exam is notable for dentures.  Mucosa is pink and moist.  She  complained of mouth and throat pain, but no thrush was seen.  NECK:  Supple without JVD or  lymphadenopathy.  CHEST:  Clear to auscultation bilaterally without wheezes, rales, or  rhonchi.  HEART:  Regular rate and rhythm without murmur, rubs, or gallops.  EXTREMITIES:  No clubbing, cyanosis, or edema.  ABDOMEN:  Soft and nontender for me.  Bowel sounds are positive.  SKIN:  Notable for back wound which was clean and intact with Steri-  Strips.  She had a lidocaine patch above the surgery site.  There is  some redness surrounding the patch area.  NEUROLOGIC:  Cranial nerves II-XII revealed some decreased hearing,  otherwise functional.  Reflexes are 2+ in the upper and lower  extremities today for me.  Sensory exam is grossly intact.  She did have  some mild tremor with intentional movements, although early this was  minimal today.  She was not obviously rigid.  Strength was 4/5 in the  upper extremities.  In the lower  extremities, she was 3/5 right hip, 2+  to 3/5 left hip, 3/5 left knee, 4/5 right knee, and 4/5 really at both  ankles.  Judgment was fair.  The patient was a bit anxious.  She is  alert and oriented x3.  Memory was generally appropriate.   ASSESSMENT AND PLAN:  1. Functional deficits secondary to a lumbar spondylosis and stenosis      with myelopathy.  The patient is status post decompressive      laminectomy and fusion at L4 through S1 on March 28, 2008.  This      is in the setting of Parkinson's disease.  The patient admitted to      receive collaborative interdisciplinary care between the      physiatrist, rehab nursing staff, and therapy team.  The patient's      level of medical complexity and substantial therapy needs in      context of that medical necessity cannot be provided at a lesser      intensity of care.  Physiatrist will provide 24-hour management of      the patient's medical needs as well as oversight of the therapy      plan/treatment and provide guidance as appropriate regarding      interaction of the two.  24-hour rehab nursing will assist the      patient in management of pain needs as well as appropriate      nutrition, skin care issues, and medication administration.  They      also will help integrate safety needs, concepts of therapy, and      other techniques.  PT will assess and treat for balance issues in      the setting of her back pain, lower extremity weakness, and      Parkinson disease.  The patient is at risk for falling both balance      and cognitive standpoints.  Incorporate adaptive equipment as      appropriate.  Family education will also be needed.  OT will assess      and treat for ADLs as well as cognitive/perceptual training,      appropriate equipment.  Case management/social worker will assess      for psychosocial issues and discharge planning.  Team conferences      will be held weekly to establish goals, assess progress, and to       determine barriers to discharge.  The patient will receive at least      3 hours therapy per day at least 5 days per week.  Rehab goals are  for mobility and supervision to min assist with most ADLs.      Estimated length of stay is 7-10 days.  Prognosis good.  2. Parkinson disease:  Continue Stalevo q.i.d.  3. The patient with history of abdominal pain.  I did not see this      predominantly on exam today.  She had intact bowel sounds.  We will      check KUB.  She had recent bowel movement yesterday which was large      followed by 1-2 loose stools.  Consider Clostridium difficile and      differential.  4. Chest pain.  The patient has a history of angina.  We will follow      clinically for now.  Check EKG.  5. Questionable Candida esophagitis.  Complete course of Diflucan.  6. Headaches:  Hydrocodone trial as opposed to Percocet per the      patient's request for something new.  7. Pain management:  See above.  Lidocaine patches for local release      of back pain.  8. Deep vein thrombosis prophylaxis, subcu Lovenox.  9. Diabetes type 2:  Cover with sliding scale insulin, checking CBGs      a.c. and h.s.  Observe with modified diet.  The patient states,      however, she has not really been eating much as of late because of      no appetite.  10.Depression, Xanax and Effexor.  11.Obstructive sleep apnea:  Continuous positive airway pressure h.s.      The patient requested to use her machine.  We will accommodate as      possible for her.      Ranelle Oyster, M.D.  Electronically Signed     ZTS/MEDQ  D:  04/01/2008  T:  04/02/2008  Job:  [id number]

## 2010-10-09 NOTE — Op Note (Signed)
[name], [name] NO.:  [id number]   MEDICAL RECORD NO.:  [id number]          PATIENT TYPE:  INP   LOCATION:  3014                         FACILITY:  [facility]   PHYSICIAN:  Cristi Loron, M.D.DATE OF BIRTH:  February 18, 1946   DATE OF PROCEDURE:  03/28/2008  DATE OF DISCHARGE:                               OPERATIVE REPORT   BRIEF HISTORY:  The patient is a 65 year old white female who suffered  from back and leg pain.  She failed medical management, was worked up  with a lumbar MRI, which demonstrated she had degeneration and stenosis,  etc., at L4-5 and L5-S1.  I discussed the various treatment options with  the patient including surgery.  She has weighed the risks, benefits, and  alternatives to surgery and decided to proceed with a L4-5, L5-S1  decompression, instrumentation, and fusion.   PREOPERATIVE DIAGNOSES:  L4-5 and L5-S1 degenerative disk disease,  spondylosis, stenosis, lumbar radiculopathy/myelopathy, lumbago.   POSTOPERATIVE DIAGNOSES:  L4-5 and L5-S1 degenerative disk disease,  spondylosis, stenosis, lumbar radiculopathy/myelopathy, lumbago.   PROCEDURE:  Decompressive laminectomy at L4 and L5 to decompress the  bilateral L4-5 and S1 nerve roots; L4-5 and L5-S1 posterior lumbar  interbody fusion with local morselized autograft bone and  Actifuse/Vitoss bone graft extender; L4-5 and L5-S1 interbody prosthesis  (Capstone PEEK interbody prosthesis); L4-5 and L5-S1 posterior segmental  instrumentation with Legacy titanium pedicle screws and rods; L4-5 and  L5-S1 posterolateral arthrodesis with local morselized autograft bone  and Actifuse/Vitoss bone graft extenders.   SURGEON:  Cristi Loron, MD   ASSISTANT:  Hilda Lias, MD   ANESTHESIA:  General endotracheal.   ESTIMATED BLOOD LOSS:  300 mL.   SPECIMENS:  None.   DRAINS:  None.   COMPLICATIONS:  None.   DESCRIPTION OF PROCEDURE:  The patient was brought to the operating room  by the anesthesia team.  General endotracheal anesthesia was induced.  The patient was turned to the prone position on the Wilson frame.  Her  lumbosacral region was then prepared with Betadine scrub and Betadine  solution.  Sterile drapes were applied.  I then injected the area to be  incised with Marcaine with epinephrine solution, used a scalpel to make  a linear midline incision over the L4-5 and L5-S1 interspaces.  I used  electrocautery to perform a bilateral subperiosteal dissection exposing  spinous process, lamina of L3, 4, 5, and upper sacrum.  We obtained an  intraoperative radiograph to confirm our location and then we inserted  the first retractor for exposure.   We began the decompression by performing bilateral L4 and L5  laminotomies with high-speed drill.  I should note that because of this  severe stenosis at L4-5 and L5-S1, a wide decompression was required  i.e., we performed a wide laminotomy with a wide lateral foraminotomy  about the bilateral L4-5 and S1 nerve roots with removing the medial  facets at L4-5 and L5-S1.  At this point, we had good decompression at  L4-5 and L5-S1.   We now turned our attention to the arthrodesis.  We incised  the L4-5 and  L5-S1 intervertebral disk with a 15-blade scalpel.  I performed a  partial intervertebral discectomy using the pituitary forceps.  We then  prepared the vertebral endplates for posterior lumbar interbody fusion  by using the using the curettes, cleared soft tissue from the vertebral  endplates at L4-5 and L5-S1.  We used trial spacers and determined to  use a 14 x 26-mm prosthesis bilaterally at both levels.  We prefilled  these prosthesis with a combination of local autograft bone, we obtained  total decompression as well as Actifuse and Vitoss bone graft extenders.  We inserted the prosthesis into the disk spaces bilaterally at L4-5 and  L5-S1, fortunately after retracting the neural structures out of harm's   way, we also filled in the disk space both anteromedial and laterally  with local autograft bone and Actifuse/Vitoss bone graft extenders.  This completed the posterior lumbar interbody fusion.   We now turned our attention to the instrumentation.  Under fluoroscopic  guidance, we cannulated the bilateral L4 and L5 and S1 pedicles with the  bone probe.  We tapped pedicles with 5.5-mm tap and then probed inside  the tapped pedicles to rule out cortical breaches.  We then inserted 7.5  x 15 mm pedicle screws bilaterally at L4 and L5 and 7.5 x 45 mm pedicle  screws bilaterally at S1 under fluoroscopic guidance.  We then palpated  along the medial aspect of the bilateral L4-5 and S1 pedicles and noted  there was no cortical breaches.  We then connected the unilateral  pedicle screws with a lordotic rod.  We compressed the construct and  secured rod in place with the capsules, we tightened appropriately.  This completed the instrumentation.   We now turned out attention to posterolateral arthrodesis.  We used high-  speed drill to decorticate the remainder of L4-5 and L5-S1 facets, pars,  and transverse processes.  We then laid a combination of local autograft  bone, Vitoss and Actifuse bone graft extenders over these decorticated  posterolateral structures.  This completed the posterolateral  arthrodesis.   We then obtained hemostasis using bipolar electrocautery.  We irrigated  the wound out with bacitracin solution.  We palpated along the ventral  surface of the thecal sac along the exit route of the L4-5, S1 nerve  roots and noted they were well decompressed.  We then removed the  retractor and then reapproximated the patient's thoracolumbar fascia  with interrupted #1 Vicryl suture, subcutaneous tissue with interrupted  2-0 Vicryl suture, and skin with Steri-Strips and Benzoin.  The wound  was then coated with bacitracin ointment and sterile dressing was  applied.  The drapes were  removed, and the patient was subsequently  returned to supine position where she was extubated by the anesthesia  team and transported to the post anesthesia care unit in stable  condition.  All sponge, instrument, and needle counts were correct at  the end of this case.      Cristi Loron, M.D.  Electronically Signed     JDJ/MEDQ  D:  03/29/2008  T:  03/30/2008  Job:  [id number]

## 2010-10-09 NOTE — Discharge Summary (Signed)
[name], [name] NO.:  [id number]   MEDICAL RECORD NO.:  [id number]          PATIENT TYPE:  IPS   LOCATION:  4036                         FACILITY:  [facility]   PHYSICIAN:  Ellwood Dense, M.D.   DATE OF BIRTH:  17-Jan-1946   DATE OF ADMISSION:  04/01/2008  DATE OF DISCHARGE:  04/05/2008                               DISCHARGE SUMMARY   DISCHARGE DIAGNOSES:  1. L4-L5 spondylolisthesis with stenosis and myelopathy requiring      posterior lumbar interbody fusion.  2. Abdominal pain, resolved.  3. Parkinson disease.  4. Diabetes mellitus type 2, diet controlled.  5. History of depression with elevated anxiety level.  6. Obstructive sleep apnea.  7. Mild hypokalemia.   HISTORY OF PRESENT ILLNESS:  Ms. [name] is a 65 year old female  with history of hypertension, Parkinson disease, back problems for years  with epidural steroid injections and no relief.  The patient is noted to  have increased pain recently with radiculopathy bilaterally with 20-  second to L4-L5, L5-S1 degenerative changes with stenosis,  spondylolisthesis, and facet arthropathy.  The patient elected to  undergo L4-S1 PLIF by Dr. Lovell Sheehan on March 28, 2008.  Postop, has had  issues with lethargy as well as urinary retention requiring in-and-out  cath.  She currently continues to have issues regarding pain control.  Requires encouragement to get out of bed as well as cues for mobility.  Rehab was consulted for progressive therapies.   REVIEW OF SYSTEMS:  The patient with complaints of sore throat,  anorexia, cough, chest pain, discomfort for the past 2 days that comes  and goes, diarrhea x2, lumbago, weakness, anxiety.   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   ALLERGIES:  MORPHINE and ESTROGEN.   FAMILY HISTORY:  Positive for coronary artery disease and cancer.   SOCIAL HISTORY:  The patient is married, independent, but sedentary  prior to admission.  Lives in one level home with 5-6 at entry.  Does  not use any tobacco or alcohol.  Husband works; however, does most of  the household.  The patient's family and husband to provide assist past  discharge.   FUNCTIONAL HISTORY:  The patient is independent.  The patient was  independent prior to admission.  Still drives on rare occasions.   FUNCTIONAL STATUS:  The patient is min assist for transfers, mod assist  ambulating 40 feet with a rolling walker, requires cues for sequencing.   PHYSICAL EXAMINATION:  GENERAL:  The patient is a well-nourished, well-  developed female, alert, in no acute distress.  HEENT:  Pupils equal, round, and reactive to light.  Ears, nose, throat  exam unremarkable.  The patient does wear dentures.  Oral mucosa pink  and moist.  No evidence of thrush noted.  NECK:  Supple without JVD or lymphadenopathy.  LUNGS:  Clear to auscultation bilaterally without  wheezes or rales.  ABDOMEN:  Soft with complaints of diffuse nonspecific pain.  Bowel  sounds positive.  EXTREMITIES:  No evidence of cyanosis or edema.  SKIN:  Back wound which is clean and dry with Steri-Strips in place.  Lidocaine patch noted above.  Surgical site is a large query area of  redness around the patch.  NEUROLOGIC:  Cranial nerves II through XII show some decreased hearing.  Reflexes 2+ in upper and lower extremity.  Sensory exam is grossly  intact.  Note, she is noted to have mild tremor with intentional  movement, although this is minimal.  No rigidity noted.  Strength is 4/5  in upper extremity and lower extremity, 3/5 in right hip, 3+-3/5 in left  hip, 3/5 in left knee, 4/5 right knee, and 4/5 bilateral ankles.  Judgment is fair.  The patient is noted to be a bit  anxious.  She is a  alert and oriented x3.  Memory is intact.  However, she is noted to be  very distractible.   HOSPITAL COURSE:  Ms. [name] is admitted to rehab on April 01, 2008, for inpatient therapies to consist of PT and OT at least three  hours 5 days a week.  During her stay in rehab, physiatry, 24-hour rehab  RN, and therapy team have worked together to provide customized  collaborative interdisciplinary care.  This is included monitoring of  the patient's progress, setting goals, as well as discussing barriers to  discharge.  The patient's PVRs were monitored with bladder scan in past  admission and no signs of retention noted.  Secondary to complaints of  abdominal pain, abdomen x-rays were done showing normal bowel gas  pattern.  No evidence of dilated loops.  An EKG was also done, it showed  normal sinus rhythm with no acute changes.  Magic mouth wash was added  to help with the patient's symptoms of sore throat.  The patient's blood  pressures were checked on b.i.d. basis during this stay.  The patient's  blood pressures were noted to trend upwards especially when the  patient's back pain was uncontrolled.  Blood pressures during this stay  have ranged from 140s-150s.  With increased pain, systolics were noted  to be as high as 170s.  Heart rate has been stable in 50s-60s range.  The patient reported problems with migraines while using Percocet;  therefore, Percocet was discontinued at admission and Norco has been  used for pain management.  A 5-10 Norco on p.r.n. basis have been  effective in the patient's pain management.  The patient's back incision  has been monitored along with dressing changes.  Nursing has been  monitoring wound.  This remains clean and dry.  The patient's diabetes  has also been monitored with a.c. h.s. CBG checks.  Blood sugars have  ranged from 90s-110 range.  P.o. intake has been good.  Labs done past  admission revealed hemoglobin 9.2,  hematocrit 26.6, white count 8.0,  platelets 161.  Check of lytes showed sodium 138, potassium 3.4,  chloride 105, CO2 26, BUN 17, creatinine 1.07, glucose 100.  LFTs  revealed SGOT 22, SGPT less than 8, alkaline phos 50, T-bili 0.7.  A  UA/UC check showed no growth.  The patient was supplemented for her  hypokalemia.   The patient's mood had been stable with anxiety levels much improved  with improvement in her mobility.  Due to the patient's quick progress  during the stay, short conference was held  to discuss the patient's  goals and barriers to discharge, as well as length of stay.  The patient  is noted to have good support at home.  Initially, barrier to discharge  was thought to be pain management.  However, with changing of the  patient over to hydrocodone, he patient's mental status has improved  with resolution of disorientation.  Currently, the patient has  progressed along from being at supervision to min assist to overall  supervision for mobility and transfers.  She is at supervision for ADLs,  requiring setup assist for upper body care and for lower body dressing  with supervision for toileting.  She is continent of bowel and bladder.  Incision is healing well.  No evidence of skin breakdown noted.  The  patient's husband has taken time off to be with her for the next few  days until sister-in-law comes into town to help provide assistance as  needed past discharge.  Further followup therapies to include home  health PT/OT by [facility].  On April 05, 2008, the patient  is discharged to home.   DISCHARGE MEDICATIONS:  1. Neurontin 800 mg q. 8 h.  2. Cozaar 100 mg per day.  3. Clonidine 0.3 mg a day.  4. Effexor XR 75 mg a day.  5. Amlodipine 10 mg a day.  6. Cardura 8 mg a day.  7. Stalevo 100 mg q.i.d.  8. Atenolol 100 mg a day.  9. Colchicine 0.6 mg 2 in a.m. and 1 in p.m. p.r.n.  10.Xanax 1 mg half p.o. b.i.d. p.r.n.  11.Norco 5/325 one-two q. 6 h.  p.r.n. pain, #75 RX.   DIET:  Diabetic diet.   WOUND CARE:  Keep area clean and dry.   ACTIVITY LEVEL:  24-hour supervision.  No strenuous activity.  No  alcohol, no smoking, no driving.  Wear brace when out of bed.   SPECIAL INSTRUCTIONS:  [facility] to provide PT/OT.   FOLLOW UP:  The patient to follow up with Dr. Egbert Garibaldi for routine check  including check of BP.  Follow up with Dr. Lovell Sheehan for postop check in  the next 2-3 weeks.  Follow up with Dr. Thyra Breed for pain  management.  Follow up with Dr. Lamar Benes as needed.      Greg Cutter, P.A.    ______________________________  Ellwood Dense, M.D.    PP/MEDQ  D:  04/05/2008  T:  04/06/2008  Job:  [id number]   cc:   Cristi Loron, M.D.  Kathrin Penner. Vear Clock, M.D.  Cheri Rous

## 2010-10-12 NOTE — Cardiovascular Report (Signed)
[name], [name]                           ACCOUNT NO.:  [id number]   MEDICAL RECORD NO.:  [id number]                   PATIENT TYPE:  INP   LOCATION:  2922                                 FACILITY:  [facility]   PHYSICIAN:  Madaline Savage, M.D.             DATE OF BIRTH:  10-Oct-1945   DATE OF PROCEDURE:  04/28/2002  DATE OF DISCHARGE:                              CARDIAC CATHETERIZATION   PROCEDURES:  1. Selective coronary angiography by Judkins technique.  2. Retrograde left heart catheterization.  3. Left ventricular angiography.  4. Abdominal aortography.   COMPLICATIONS:  None.   ENTRY SITE:  Right femoral.   DYE USED:  Omnipaque.   PATIENT PROFILE:  The patient is a 65 year old married female who has a 22-  year history of hypertension and elevated cholesterol who has had chest pain  episodes in the past and has previously undergone catheterization about two  years ago that was negative for coronary disease.  The patient was  apparently transferred from [facility] office to our  inpatient service for chest pain and elevated blood pressure and enters the  catheterization lab today without chest pain and on IV fenoldopam.   RESULTS:   PRESSURES:  The left ventricular pressure was 125/5, end-diastolic pressure  10, central aortic pressure 125/65, mean 90.  No aortic valve gradient by  pullback technique.   ANGIOGRAPHIC RESULTS:  The left main coronary artery  was normal.   The left anterior descending coronary artery coursed to the cardiac apex and  gave rise to one small bifurcating diagonal branch arising before the first  septal perforator.  The diagonal showed no lesions.  There was also a second  diagonal that showed no lesions.  The LAD itself showed some luminal  irregularities in the LAD after the third septal perforator branch that did  not appear to have plaque but just some luminal irregularities.   The circumflex coronary artery was a  large vessel terminating as a  posterolateral branch.  There was a large bifurcating obtuse marginal branch  which arose in the first third of the circumflex itself.  No lesions were  noted in the circumflex or the OM branch.   The right coronary artery was a medium size vessel with luminal  irregularities in the mid portion of the vessel on a segmental basis.  No  discrete plaque was noted, and the distal vessel was normal as was a  posterior descending branch.   The left ventricle showed normal contractility, ejection fraction estimated  about 60%.  There was a suggestion of left ventricular hypertrophy from the  LV angiogram.  No mitral regurgitation was seen.  No wall motion  abnormalities are noted.   Abdominal aortography shows normal renal arteries, normal abdominal aorta,  and at least the common iliacs do appear to be normal.   FINAL DIAGNOSES:  1. Angiographically patent coronary arteries with  luminal irregularities in     mid left anterior descending coronary artery and mid right coronary     artery.  2. Normal left ventricular systolic function, ejection fraction estimate of     60 to 65%.  3. Normal abdominal aorta.  4. Normal renal arteries.                                               Madaline Savage, M.D.    WHG/MEDQ  D:  04/28/2002  T:  04/28/2002  Job:  [id number]   cc:   [facility]  [address]  [place], [place]  [address]   Nanetta Batty, M.D.  [phone]  [place]  [place] [address]  Fax: [phone]   [facility] Cardiac Catheterization Lab

## 2010-10-12 NOTE — Discharge Summary (Signed)
[name], [name]                           ACCOUNT NO.:  [id number]   MEDICAL RECORD NO.:  [id number]                   PATIENT TYPE:  INP   LOCATION:  2011                                 FACILITY:  [facility]   PHYSICIAN:  Nanetta Batty, M.D.                DATE OF BIRTH:  April 05, 1946   DATE OF ADMISSION:  04/27/2002  DATE OF DISCHARGE:  04/30/2002                                 DISCHARGE SUMMARY   ADMISSION DIAGNOSES:  1. Chest pain, rule out myocardial infarction.  2. Accelerated hypertension.  3. Gout.  4. Gastroesophageal reflux disease.  5. Depression/anxiety.   DISCHARGE DIAGNOSES:  1. Chest pain, resolved, myocardial infarction ruled out with negative     enzymes.  Cardiac catheterization revealing essentially normal coronary     arteries, noncardiac chest pain, probably due to hypertensive urgency.  2. Hypertension, improved control.  3. Symptoms of sleep apnea.  4. Gout.  5. Gastroesophageal reflux disease.  6. Depression/anxiety.   HISTORY OF PRESENT ILLNESS:  The patient is a 65 year old white female, with  history of hypertension, obesity, and family history of premature coronary  artery disease.  She had a Cardiolite on February 13, 2002 which showed no  ischemia and normal LV function.  Apparently, she has had a catheterization  remotely and we do not have the results of this.  The patient reports that  it was normal.   The patient states she has had increased blood pressure since the end of  September with intermittent chest pain and relieved with nitroglycerin.  She  saw her primary care Malayshia All, Dr. Delphia Grates with [facility] on  Friday and the Atenolol and Clonidine were increased.  She works the night  shift and awoke today around noon with chest pain, shortness of breath,  diaphoresis, and nausea rated an 8 to 9 over 10.  She took four  nitroglycerin longer than five minutes apart without complete relief.  Our  office advised her to come to  the emergency room.   Currently, she is having chest pain rated 4 to 5 over 10.  Blood pressure is  205/110 and has oxygen by nasal cannula.   Admission EKG shows normal sinus rhythm, cannot exclude prior anteroseptal  MI.  Mild T-wave inversion V4 to V6 which may be slightly more prominent  than prior EKGs.  The patient was admitted for management of accelerated  hypertension and we will treat chest pain, rule out MI.   PROCEDURE:  Cardiac catheterization April 28, 2002 by Dr. Madaline Savage.   COMPLICATIONS:  None.   HOSPITAL COURSE:  The patient was admitted to [facility]  on April 27, 2002 with symptoms of chest pain and significantly elevated  hypertension.  Blood pressure on admission was 205/103.  Admission labs  showed a TSH of 1.062.  Sodium 141, potassium 3.8, BUN 11, creatinine 0.9.  LFTs slightly  elevated with a AST of 40, ALT 48, and ALP of 129.  Cardiac  enzymes were negative x 2.  WBC 7.9, hemoglobin 14.4, platelets 232,000.  INR 1.1.   Upon admission to the hospital, the patient was started on IV nitroglycerin  to keep blood pressure down.  All medications were continued.  Heparin was  started per pharmacy.   On April 28, 2002, the patient was taken to the cardiac catheterization  lab by Dr. Madaline Savage.  This revealed a normal left main, essentially  widely patent LAD with some diffuse irregularity in the midportion.  Circumflex widely patent with a large OM1 branch.  RCA widely patent with  some mild diffuse irregularity in the midportion.  EF 60%.  Renals okay.  Distal aortography normal.   A spiral CT was ordered to rule out PE or dissection because of symptoms and  significantly elevated blood pressure.  The CT was negative for PE and  negative for thoracic aneurysm disease.   It was felt that the patient's chest pain was probably related to  hypertensive urgency.  Of note, she was treated with fenoldopam prior to  cardiac  catheterization.   Postcatheterization hemoglobin 13.7, platelets 228,000.  BUN 10, creatinine  0.9.  Potassium 3.9.  Of note, fenoldopam was used.   On April 30, 2002, the patient was felt stable for discharge to home.  Blood pressure was still slightly elevated 150/92 and pulse at 68.  Medication adjustments were made.   Dr. Cristy Hilts. Ganji advised the patient to cut salt from her diet.  He  recommended weight loss, exercise, etc.  He also feels that she may be a  good candidate for obstructive sleep apnea study; however, the patient works  third shift and generally sleeps during the day and at our center they only  have a Pensions consultant who works the night shift.  We will address this as an  outpatient.   DISCHARGE MEDICATIONS:  1. Clonidine 0.3 mg b.i.d. (increased).  2. Atenolol 50 mg 2 pills q.d. (increased).  3. Lisinopril 80 mg q.d. (increased).  4. Norvasc 10 mg q.d.  5. Aspirin 81 mg q.d.  6. Effexor XR 75 mg q.d.  7. Colchicine 0.6 mg t.i.d.  8. Clarinex 5 mg q.d.  9. Alprazolam p.r.n.  10.      Naproxen 500 mg as needed or as directed.  11.      Nitroglycerin as directed for chest pain.  12.      Tylenol 1-2 q.4-6h. p.r.n. for fever or headache.   ACTIVITY:  No strenuous activity or lifting over five pounds.  No sexual  activity for two days.   DIET:  Low fat, no salt diet.    WOUND CARE:  The patient may shower.  She knows to call the office for any  problems or questions.   FOLLOW UP:  She will follow up with Dr. Nanetta Batty on May 17, 2002  at 12:25.      [name], P.A.                   Nanetta Batty, M.D.    TCJ/MEDQ  D:  04/30/2002  T:  04/30/2002  Job:  [id number]   cc:   Nanetta Batty, M.D.  [address]  [place]  [place] [address]  Fax: [phone]   Delphia Grates, M.D.  [facility]

## 2010-10-12 NOTE — Discharge Summary (Signed)
[name], [name] NO.:  [id number]   MEDICAL RECORD NO.:  [id number]          PATIENT TYPE:  INP   LOCATION:  3014                         FACILITY:  [facility]   PHYSICIAN:  Cristi Loron, M.D.DATE OF BIRTH:  02-29-1946   DATE OF ADMISSION:  03/28/2008  DATE OF DISCHARGE:  04/01/2008                               DISCHARGE SUMMARY   BRIEF HISTORY:  The patient is a 65 year old white female who has  suffered from back and leg pain.  She failed medical management and was  worked up with a lumbar MRI, which demonstrated she had degenerative  disease, stenosis, etc. at L4-5 and L5-S1.  I discussed various  treatment options with the patient and her husband including surgery.  The patient has weighed the risks, benefits, and alternatives of surgery  and decided to proceed with L4-5 and L5-S1 decompression,  instrumentation, and fusion.   For further details of this admission, please refer to typed history and  physical.   HOSPITAL COURSE:  I admitted the patient to [facility] on  March 28, 2008.  On the day of admission, I performed an L4-5 and L5-  S1 decompression, instrumentation, and fusion.  Surgery went well (for  full details of this operation, please refer to typed operative note).   POSTOPERATIVE COURSE:  The patient's postoperative course was as  follows.  The patient did have a bit of bloody wound drainage which  resolved.  Her creatinine increased to 1.42.  We repeated this and it  dropped to 1.18.  Urine output was adequate.  The patient did develop a  bit of thrush which was treated with p.o. Diflucan.   We had PT, OT, and the rehab team see the patient.  She was felt to be  stable for inpatient rehab, was transferred there on April 01, 2008.   FINAL DIAGNOSES:  1. L4-5 and L5-S1 degenerative disease.  2. Spondylosis.  3. Stenosis.  4. Lumbar radiculopathy/myelopathy.  5. Lumbago.   PROCEDURES PERFORMED:  Decompressive  laminectomy at L4 and L5 to  decompress bilateral L4, L5, and S1 nerve roots; L4-5 and L5-S1  posterior lumbar interbody fusion with local morselized autograft bone  and Actifuse/Vitoss bone graft extender; insertion of L4-5 and L5-S1  interbody prosthesis (Capstone PEEK interbody prosthesis); L4-5 and L5-  S1 posterior segmental instrumentation with Legacy titanium pedicle  screws and rods; L4-5 and L5-S1 posterior arthrodesis with local  morselized autograft bone and Actifuse/Vitoss bone graft extenders.      Cristi Loron, M.D.  Electronically Signed    JDJ/MEDQ  D:  05/05/2008  T:  05/06/2008  Job:  [id number]

## 2010-10-17 NOTE — Op Note (Signed)
[name], [name]                 ACCOUNT NO.:  [id number]  MEDICAL RECORD NO.:  [id number]           PATIENT TYPE:  I  LOCATION:  3010                         FACILITY:  [facility]  PHYSICIAN:  Cristi Loron, M.D.DATE OF BIRTH:  [birth year]  DATE OF PROCEDURE:  10/01/2010 DATE OF DISCHARGE:                              OPERATIVE REPORT   BRIEF HISTORY:  The patient is a 65 year old white female who I performed a L4-5 and L5-1 decompression instrumentation and fusion several years ago who initially did well, but more recently has developed back and leg pain consistent with neurogenic claudication. She has failed medical management, was worked up with a lumbar MRI which demonstrated the patient had spinal stenosis, spondylolisthesis at L3-4. I discussed the various treatment with the patient including surgery. She has weighed the risks, benefits and alternatives and decided to proceed with a exploration of a lumbar fusion with an L3-4 decompression instrumentation and fusion.  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  PROCEDURE:  Exploration of lumbar fusion; bilateral L3 laminotomies, foraminotomies to decompress bilateral L3 as well as L4 nerve roots (the work required to do this was in addition to work required to do the posterior lumbar fusion because the patient's severe spinal stenosis and spondylolisthesis requiring a wide laminectomy, facetectomy and nerve root decompression); L3-4 posterior lumbar fusion with local morselized autograft bone and Actifuse bone graft extenders; insertion of L3-4 interbody prosthesis (globus PEEK interbody prosthesis).  L3-4 approach posterior instrumentation with globus extends pedicle screws and rods titanium plates and rods; L3-4 posterolateral  arthrodesis with local morselized autograft bone and Actifuse bone graft extender.  SURGEON:  Cristi Loron, MD  ASSISTANT:  Barnett Abu, MD  ANESTHESIA:  General endotracheal.  ESTIMATED BLOOD LOSS:  350 mL.  SPECIMENS:  None.  DRAINS:  None.  COMPLICATIONS:  None.  DESCRIPTION OF PROCEDURE:  The patient was brought to the operating room by the Anesthesia Team.  General endotracheal anesthesia was induced. The patient was turned to prone position on the [place] frame.  The lumbosacral region was then prepared with Betadine scrub and Betadine solution.  Sterile drapes were applied.  I then injected the area to be incised with Marcaine with epinephrine solution.  I used scalpel to make a linear midline incision through the patient's previous surgical scar. I used electrocautery to perform a bilateral subperiosteal dissection exposing spinous process lamina of L2, L3, L4 and L5.  I used electrocautery to expose the previous hardware.  I then inspected the L4- 5 and L5-S1 lateral masses appear to be well fused.  This completed the exploration of lumbar fusion.  I now turned my attention to the decompression, I used high-speed drill to perform bilateral L3 laminotomies, I widened the laminotomy with Kerrison punch, decompressing thecal sac at L3-4.  I removed the medial aspect of the L3-4 facets.  There was quite a bit stenosis at L3-4.  I performed a wide foraminotomies about the bilateral L3 and L4 nerve roots completing decompression.  I  now turned my attention to the posterior lumbar interbody fusion, I incised the L3-4 intervertebral disk with 15-blade scalpel.  I performed a partial intervertebral diskectomy with a pituitary forceps and I then prepared the vertebral endplates for fusion by using curettes to close the disk from the vertebral endplates.  I then distracted the L3-4 interspace and we determined to use a 8 x 26 mm bioprosthesis.  I determine to use a 10 x  26 PEEK interbody prosthesis.  I prefilled this prosthesis with combination of local autograft bone and Actifuse bone graft extender.  I inserted the prosthesis in the interspaces at L3-4 of course after retracting the neural structures out of harm's way.  There was a good snug fit of prosthesis interspace and I prefilled the remainder of the disk space with local autograft bone and Actifuse bone graft extender.  This completed posterior lumbar interbody fusion.  I now turned my attention to posterior spinal instrumentation.  Under fluoroscopic guidance, I cannulated the bilateral L3 pedicles with a bone probe.  I tapped the pedicle with a 6.5 mm tap.  I then removed the tap and probed inside the tapped pedicles for wide cortical breeches.  I then inserted 7.5 x 50 and 55 mm pedicle screws into the L3-5 pedicles bilaterally under fluoroscopic guidance.  I then palpated along the medial aspect of the bilateral L3 pedicles and noticed no cortical breeches.  I then connected the new pedicle screws to the old instrumentation using the extending rods.  A tightness appropriately after compressing the L3-4 interspace.  This completed instrumentation.  I now turned my attention to posterolateral arthrodesis, I used a high- speed drill to decorticate the remainder of the L3-4 facets transverse process, pars, etc.  I then laid a combination of local autograft bone and Actifuse bone graft extender over these decorticated posterolateral structures.  This completed the posterolateral arthrodesis.  I then inspected the L3-4 at thecal sac and bilateral L3-L4 nerve roots and noted to be well decompressed.  We obtained hemostasis using bipolar electrocautery.  We irrigated the wound out with bacitracin solution and I then removed the retractors and then reapproximated the patient's thoracolumbar fascia with interrupted #1 Vicryl suture, subcutaneous tissue with interrupted 2-0 Vicryl suture and skin  with Steri-Strips and Benzoin.  The wound was then coated with bacitracin ointment and sterile dressing applied.  The drapes were removed and the patient was subsequently returned to supine position where she was extubated by the Anesthesia Team and transported to Post Anesthesia Care Unit in stable condition.  All sponge, instrument and needle counts were correct at the case.     Cristi Loron, M.D.     JDJ/MEDQ  D:  10/01/2010  T:  10/02/2010  Job:  [id number]  Electronically Signed by Tressie Stalker M.D. on 10/17/2010 02:24:53 PM

## 2010-12-07 ENCOUNTER — Other Ambulatory Visit (HOSPITAL_COMMUNITY): Payer: Self-pay | Admitting: Family Medicine

## 2010-12-07 DIAGNOSIS — Z1231 Encounter for screening mammogram for malignant neoplasm of breast: Secondary | ICD-10-CM

## 2010-12-18 ENCOUNTER — Ambulatory Visit (HOSPITAL_COMMUNITY)
Admission: RE | Admit: 2010-12-18 | Discharge: 2010-12-18 | Disposition: A | Discharge status: Home / Self Care | Payer: PRIVATE HEALTH INSURANCE | Source: Ambulatory Visit | Attending: Family Medicine | Admitting: Family Medicine

## 2010-12-18 DIAGNOSIS — Z1231 Encounter for screening mammogram for malignant neoplasm of breast: Secondary | ICD-10-CM | POA: Insufficient documentation

## 2010-12-21 ENCOUNTER — Other Ambulatory Visit: Payer: Self-pay | Admitting: Family Medicine

## 2010-12-21 DIAGNOSIS — R928 Other abnormal and inconclusive findings on diagnostic imaging of breast: Secondary | ICD-10-CM

## 2010-12-31 ENCOUNTER — Ambulatory Visit
Admission: RE | Admit: 2010-12-31 | Discharge: 2010-12-31 | Disposition: A | Discharge status: Home / Self Care | Payer: PRIVATE HEALTH INSURANCE | Source: Ambulatory Visit | Attending: Family Medicine | Admitting: Family Medicine

## 2010-12-31 ENCOUNTER — Other Ambulatory Visit: Payer: Self-pay | Admitting: Family Medicine

## 2010-12-31 DIAGNOSIS — R928 Other abnormal and inconclusive findings on diagnostic imaging of breast: Secondary | ICD-10-CM

## 2011-01-11 ENCOUNTER — Ambulatory Visit
Admission: RE | Admit: 2011-01-11 | Discharge: 2011-01-11 | Disposition: A | Discharge status: Home / Self Care | Payer: PRIVATE HEALTH INSURANCE | Source: Ambulatory Visit | Attending: Family Medicine | Admitting: Family Medicine

## 2011-01-11 ENCOUNTER — Other Ambulatory Visit: Payer: Self-pay | Admitting: Family Medicine

## 2011-01-11 DIAGNOSIS — R928 Other abnormal and inconclusive findings on diagnostic imaging of breast: Secondary | ICD-10-CM

## 2011-02-25 LAB — TYPE AND SCREEN: ABO/RH(D): O POS

## 2011-02-25 LAB — HEPATIC FUNCTION PANEL
ALT: <8
Alkaline Phosphatase: 101
Bilirubin, Direct: 0.2
Indirect Bilirubin: 0.7

## 2011-02-25 LAB — CBC
HCT: 40.5
MCHC: 34.2
MCV: 95.2
Platelets: 160
RBC: 4.26
WBC: 6.9

## 2011-02-25 LAB — BASIC METABOLIC PANEL
BUN: 20
CO2: 24
Chloride: 111
Potassium: 4.1

## 2011-02-26 LAB — DIFFERENTIAL
Eosinophils Absolute: 0.4
Lymphs Abs: 1.4
Monocytes Relative: 12
Neutro Abs: 5.2
Neutrophils Relative %: 65

## 2011-02-26 LAB — BASIC METABOLIC PANEL
BUN: 21
CO2: 23
CO2: 24
Calcium: 7.7 — ABNORMAL LOW
Calcium: 8 — ABNORMAL LOW
Chloride: 107
Creatinine, Ser: 1.4 — ABNORMAL HIGH
GFR calc non Af Amer: 45 — ABNORMAL LOW
GFR calc non Af Amer: 47 — ABNORMAL LOW
Glucose, Bld: 160 — ABNORMAL HIGH
Glucose, Bld: 92
Potassium: 3.6
Potassium: 4
Sodium: 140

## 2011-02-26 LAB — GLUCOSE, CAPILLARY
Glucose-Capillary: 100 — ABNORMAL HIGH
Glucose-Capillary: 101 — ABNORMAL HIGH
Glucose-Capillary: 105 — ABNORMAL HIGH
Glucose-Capillary: 106 — ABNORMAL HIGH
Glucose-Capillary: 107 — ABNORMAL HIGH
Glucose-Capillary: 108 — ABNORMAL HIGH
Glucose-Capillary: 114 — ABNORMAL HIGH
Glucose-Capillary: 87
Glucose-Capillary: 90
Glucose-Capillary: 91

## 2011-02-26 LAB — CBC
HCT: 26.6 — ABNORMAL LOW
HCT: 30.9 — ABNORMAL LOW
Hemoglobin: 10.8 — ABNORMAL LOW
Hemoglobin: 9.2 — ABNORMAL LOW
MCHC: 34.5
MCHC: 34.8
MCV: 94.6
MCV: 94.9
Platelets: 109 — ABNORMAL LOW
Platelets: 113 — ABNORMAL LOW
RBC: 2.8 — ABNORMAL LOW
RDW: 14
RDW: 14.1
RDW: 14.3

## 2011-02-26 LAB — COMPREHENSIVE METABOLIC PANEL
ALT: 22
ALT: <8
AST: 34
Albumin: 2.4 — ABNORMAL LOW
Alkaline Phosphatase: 52
BUN: 17
CO2: 26
Calcium: 8 — ABNORMAL LOW
Creatinine, Ser: 1.07
GFR calc Af Amer: 46 — ABNORMAL LOW
GFR calc non Af Amer: 52 — ABNORMAL LOW
Glucose, Bld: 100 — ABNORMAL HIGH
Potassium: 4.1
Sodium: 137
Total Protein: 4.2 — ABNORMAL LOW
Total Protein: 4.8 — ABNORMAL LOW

## 2011-02-26 LAB — URINE CULTURE

## 2011-02-26 LAB — URINALYSIS, ROUTINE W REFLEX MICROSCOPIC
Hgb urine dipstick: NEGATIVE
Ketones, ur: 15 — AB
Nitrite: NEGATIVE
pH: 6

## 2011-02-26 LAB — URINE MICROSCOPIC-ADD ON

## 2011-02-27 ENCOUNTER — Other Ambulatory Visit: Payer: Self-pay | Admitting: Family Medicine

## 2011-02-27 DIAGNOSIS — N649 Disorder of breast, unspecified: Secondary | ICD-10-CM

## 2011-03-05 ENCOUNTER — Emergency Department (HOSPITAL_COMMUNITY)
Admission: EM | Admit: 2011-03-05 | Discharge: 2011-03-05 | Disposition: A | Discharge status: Home / Self Care | Payer: PRIVATE HEALTH INSURANCE | Attending: Emergency Medicine | Admitting: Emergency Medicine

## 2011-03-05 ENCOUNTER — Emergency Department (HOSPITAL_COMMUNITY): Payer: PRIVATE HEALTH INSURANCE

## 2011-03-05 DIAGNOSIS — G20 Parkinson's disease: Secondary | ICD-10-CM | POA: Insufficient documentation

## 2011-03-05 DIAGNOSIS — R0602 Shortness of breath: Secondary | ICD-10-CM | POA: Insufficient documentation

## 2011-03-05 DIAGNOSIS — R0789 Other chest pain: Secondary | ICD-10-CM | POA: Insufficient documentation

## 2011-03-05 DIAGNOSIS — G20A1 Parkinson's disease without dyskinesia, without mention of fluctuations: Secondary | ICD-10-CM | POA: Insufficient documentation

## 2011-03-05 DIAGNOSIS — R079 Chest pain, unspecified: Secondary | ICD-10-CM | POA: Insufficient documentation

## 2011-03-05 DIAGNOSIS — I10 Essential (primary) hypertension: Secondary | ICD-10-CM | POA: Insufficient documentation

## 2011-04-04 ENCOUNTER — Other Ambulatory Visit: Payer: PRIVATE HEALTH INSURANCE

## 2011-04-11 ENCOUNTER — Ambulatory Visit
Admission: RE | Admit: 2011-04-11 | Discharge: 2011-04-11 | Disposition: A | Discharge status: Home / Self Care | Payer: PRIVATE HEALTH INSURANCE | Source: Ambulatory Visit | Attending: Family Medicine | Admitting: Family Medicine

## 2011-04-11 DIAGNOSIS — N649 Disorder of breast, unspecified: Secondary | ICD-10-CM

## 2011-08-13 ENCOUNTER — Other Ambulatory Visit (HOSPITAL_COMMUNITY): Payer: Self-pay | Admitting: Neurosurgery

## 2011-08-13 DIAGNOSIS — M4310 Spondylolisthesis, site unspecified: Secondary | ICD-10-CM

## 2011-08-13 DIAGNOSIS — M48061 Spinal stenosis, lumbar region without neurogenic claudication: Secondary | ICD-10-CM

## 2011-08-15 DIAGNOSIS — M545 Low back pain, unspecified: Secondary | ICD-10-CM | POA: Insufficient documentation

## 2011-08-15 DIAGNOSIS — J45909 Unspecified asthma, uncomplicated: Secondary | ICD-10-CM | POA: Insufficient documentation

## 2011-08-15 DIAGNOSIS — G20 Parkinson's disease: Secondary | ICD-10-CM | POA: Insufficient documentation

## 2011-08-15 DIAGNOSIS — E119 Type 2 diabetes mellitus without complications: Secondary | ICD-10-CM | POA: Insufficient documentation

## 2011-08-15 DIAGNOSIS — M47817 Spondylosis without myelopathy or radiculopathy, lumbosacral region: Secondary | ICD-10-CM | POA: Insufficient documentation

## 2011-08-15 DIAGNOSIS — G20A1 Parkinson's disease without dyskinesia, without mention of fluctuations: Secondary | ICD-10-CM | POA: Insufficient documentation

## 2011-08-15 DIAGNOSIS — Z79899 Other long term (current) drug therapy: Secondary | ICD-10-CM | POA: Insufficient documentation

## 2011-08-15 DIAGNOSIS — G8929 Other chronic pain: Secondary | ICD-10-CM | POA: Insufficient documentation

## 2011-08-15 DIAGNOSIS — I10 Essential (primary) hypertension: Secondary | ICD-10-CM | POA: Insufficient documentation

## 2011-08-15 DIAGNOSIS — Z8673 Personal history of transient ischemic attack (TIA), and cerebral infarction without residual deficits: Secondary | ICD-10-CM | POA: Insufficient documentation

## 2011-08-15 DIAGNOSIS — F329 Major depressive disorder, single episode, unspecified: Secondary | ICD-10-CM | POA: Insufficient documentation

## 2011-08-15 DIAGNOSIS — IMO0001 Reserved for inherently not codable concepts without codable children: Secondary | ICD-10-CM | POA: Insufficient documentation

## 2011-08-15 DIAGNOSIS — F3289 Other specified depressive episodes: Secondary | ICD-10-CM | POA: Insufficient documentation

## 2011-08-15 DIAGNOSIS — M79609 Pain in unspecified limb: Secondary | ICD-10-CM | POA: Insufficient documentation

## 2011-08-16 ENCOUNTER — Emergency Department (HOSPITAL_COMMUNITY)
Admission: EM | Admit: 2011-08-16 | Discharge: 2011-08-16 | Disposition: A | Discharge status: Home / Self Care | Payer: BC Managed Care – PPO | Attending: Emergency Medicine | Admitting: Emergency Medicine

## 2011-08-16 ENCOUNTER — Telehealth (HOSPITAL_COMMUNITY): Payer: Self-pay | Admitting: *Deleted

## 2011-08-16 ENCOUNTER — Encounter (HOSPITAL_COMMUNITY): Payer: Self-pay | Admitting: Emergency Medicine

## 2011-08-16 DIAGNOSIS — G8929 Other chronic pain: Secondary | ICD-10-CM

## 2011-08-16 DIAGNOSIS — M1990 Unspecified osteoarthritis, unspecified site: Secondary | ICD-10-CM

## 2011-08-16 LAB — CBC
HCT: 38.5 % (ref 36.0–46.0)
Hemoglobin: 13.9 g/dL (ref 12.0–15.0)
MCV: 93.2 fL (ref 78.0–100.0)
WBC: 6.4 10*3/uL (ref 4.0–10.5)

## 2011-08-16 LAB — URINALYSIS, ROUTINE W REFLEX MICROSCOPIC
Glucose, UA: NEGATIVE mg/dL
Hgb urine dipstick: NEGATIVE
Ketones, ur: NEGATIVE mg/dL
Leukocytes, UA: NEGATIVE
pH: 8 (ref 5.0–8.0)

## 2011-08-16 LAB — DIFFERENTIAL
Lymphocytes Relative: 47 % — ABNORMAL HIGH (ref 12–46)
Monocytes Absolute: 0.6 10*3/uL (ref 0.1–1.0)
Monocytes Relative: 9 % (ref 3–12)
Neutro Abs: 2.6 10*3/uL (ref 1.7–7.7)

## 2011-08-16 LAB — BASIC METABOLIC PANEL
BUN: 13 mg/dL (ref 6–23)
CO2: 25 mEq/L (ref 19–32)
Chloride: 104 mEq/L (ref 96–112)
Creatinine, Ser: 0.68 mg/dL (ref 0.50–1.10)
Glucose, Bld: 95 mg/dL (ref 70–99)

## 2011-08-16 LAB — URINE MICROSCOPIC-ADD ON

## 2011-08-16 NOTE — ED Notes (Signed)
PT. REPORTS LOW BACK PAIN , GENERALIZED BODY ACHES WITH OCCASIONAL NAUSEA AND CONSTIPATION .

## 2011-08-16 NOTE — Telephone Encounter (Signed)
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   NO  Ever got metal in your eyes?   NO  If yes, was it removed by a doctor?   Brain Surgery  NO    Inner Ear Surgery NO   Renal/Liver Dz   NO   Heart Surgery  NO   Pacemaker  NO   High BP YES   Eye Implants   NO   Pregnancy   NO  Diabetic  NO   Stent   NO   Hx of cancer   NO    1.  Report to radiology on first floor on 10 At am/pm  2.  Do Not eat food after 2 AM         Do Not drink clear liquids after 4 AM  3.  If discharged the same day as procedure, you will need a responsible adult to drive          You home.  Who will drive you home husband [name]  4.  If discharged the day of your procedure, a responsible adult should be with the patient      For 8 hours   5.  If plans include a taxi or bus for transportation home after procedure, a friend or family      member must accompany you      6.  If taking routine medications, please list the names and dosages of all your                   medications, or bring these medications to the hospital for identification    7.  Take usual medications the morning of your procedure,  all  8.  Do you have a history of heart problems? you   9.  Do you have a cardiologist? Yes Willa Rough  10.  Do you have any metal or implants inside your body? no  11.  Do you have any allergies to food or medications? Estrogens, hydromorphone, hydrocodone, tramadol, neurontin  12.  Do you have any allergies to latex or contrast dye?  No  13.  To whom were these instructions given? Pt

## 2011-08-16 NOTE — Discharge Instructions (Signed)
Arthritis, Nonspecific Arthritis is inflammation of a joint. This usually means pain, redness, warmth or swelling are present. One or more joints may be involved. There are a number of types of arthritis. Your caregiver may not be able to tell what type of arthritis you have right away. CAUSES  The most common cause of arthritis is the wear and tear on the joint (osteoarthritis). This causes damage to the cartilage, which can break down over time. The knees, hips, back and neck are most often affected by this type of arthritis. Other types of arthritis and common causes of joint pain include:  Sprains and other injuries near the joint. Sometimes minor sprains and injuries cause pain and swelling that develop hours later.   Rheumatoid arthritis. This affects hands, feet and knees. It usually affects both sides of your body at the same time. It is often associated with chronic ailments, fever, weight loss and general weakness.   Crystal arthritis. Gout and pseudo gout can cause occasional acute severe pain, redness and swelling in the foot, ankle, or knee.   Infectious arthritis. Bacteria can get into a joint through a break in overlying skin. This can cause infection of the joint. Bacteria and viruses can also spread through the blood and affect your joints.   Drug, infectious and allergy reactions. Sometimes joints can become mildly painful and slightly swollen with these types of illnesses.  SYMPTOMS   Pain is the main symptom.   Your joint or joints can also be red, swollen and warm or hot to the touch.   You may have a fever with certain types of arthritis, or even feel overall ill.   The joint with arthritis will hurt with movement. Stiffness is present with some types of arthritis.  DIAGNOSIS  Your caregiver will suspect arthritis based on your description of your symptoms and on your exam. Testing may be needed to find the type of arthritis:  Blood and sometimes urine tests.    X-ray tests and sometimes CT or MRI scans.   Removal of fluid from the joint (arthrocentesis) is done to check for bacteria, crystals or other causes. Your caregiver (or a specialist) will numb the area over the joint with a local anesthetic, and use a needle to remove joint fluid for examination. This procedure is only minimally uncomfortable.   Even with these tests, your caregiver may not be able to tell what kind of arthritis you have. Consultation with a specialist (rheumatologist) may be helpful.  TREATMENT  Your caregiver will discuss with you treatment specific to your type of arthritis. If the specific type cannot be determined, then the following general recommendations may apply. Treatment of severe joint pain includes:  Rest.   Elevation.   Anti-inflammatory medication (for example, ibuprofen) may be prescribed. Avoiding activities that cause increased pain.   Only take over-the-counter or prescription medicines for pain and discomfort as recommended by your caregiver.   Cold packs over an inflamed joint may be used for 10 to 15 minutes every hour. Hot packs sometimes feel better, but do not use overnight. Do not use hot packs if you are diabetic without your caregiver's permission.   A cortisone shot into arthritic joints may help reduce pain and swelling.   Any acute arthritis that gets worse over the next 1 to 2 days needs to be looked at to be sure there is no joint infection.  Long-term arthritis treatment involves modifying activities and lifestyle to reduce joint stress jarring. This can  include weight loss. Also, exercise is needed to nourish the joint cartilage and remove waste. This helps keep the muscles around the joint strong. HOME CARE INSTRUCTIONS   Do not take aspirin to relieve pain if gout is suspected. This elevates uric acid levels.   Only take over-the-counter or prescription medicines for pain, discomfort or fever as directed by your caregiver.    Rest the joint as much as possible.   If your joint is swollen, keep it elevated.   Use crutches if the painful joint is in your leg.   Drinking plenty of fluids may help for certain types of arthritis.   Follow your caregiver's dietary instructions.   Try low-impact exercise such as:   Swimming.   Water aerobics.   Biking.   Walking.   Morning stiffness is often relieved by a warm shower.   Put your joints through regular range-of-motion.  SEEK MEDICAL CARE IF:   You do not feel better in 24 hours or are getting worse.   You have side effects to medications, or are not getting better with treatment.  SEEK IMMEDIATE MEDICAL CARE IF:   You have a fever.   You develop severe joint pain, swelling or redness.   Many joints are involved and become painful and swollen.   There is severe back pain and/or leg weakness.   You have loss of bowel or bladder control.  Document Released: 06/20/2004 Document Revised: 05/02/2011 Document Reviewed: 07/06/2008 [facility] Patient Information 2012 [place], [place].Degenerative Arthritis You have osteoarthritis. This is the wear and tear arthritis that comes with aging. It is also called degenerative arthritis. This is common in people past middle age. It is caused by stress on the joints. The large weight bearing joints of the lower extremities are most often affected. The knees, hips, back, neck, and hands can become painful, swollen, and stiff. This is the most common type of arthritis. It comes on with age, carrying too much weight, or from an injury. Treatment includes resting the sore joint until the pain and swelling improve. Crutches or a walker may be needed for severe flares. Only take over-the-counter or prescription medicines for pain, discomfort, or fever as directed by your caregiver. Local heat therapy may improve motion. Cortisone shots into the joint are sometimes used to reduce pain and swelling during flares. Osteoarthritis  is usually not crippling and progresses slowly. There are things you can do to decrease pain:  Avoid high impact activities.   Exercise regularly.   Low impact exercises such as walking, biking and swimming help to keep the muscles strong and keep normal joint function.   Stretching helps to keep your range of motion.   Lose weight if you are overweight. This reduces joint stress.  In severe cases when you have pain at rest or increasing disability, joint surgery may be helpful. See your caregiver for follow-up treatment as recommended.  SEEK IMMEDIATE MEDICAL CARE IF:   You have severe joint pain.   Marked swelling and redness in your joint develops.   You develop a high fever.  Document Released: 05/13/2005 Document Revised: 05/02/2011 Document Reviewed: 10/13/2006 [facility] Patient Information 2012 [place], [place].Back Pain, Adult Low back pain is very common. About 1 in 5 people have back pain.The cause of low back pain is rarely dangerous. The pain often gets better over time.About half of people with a sudden onset of back pain feel better in just 2 weeks. About 8 in 10 people feel better by 6 weeks.  CAUSES Some common causes of back pain include:  Strain of the muscles or ligaments supporting the spine.   Wear and tear (degeneration) of the spinal discs.   Arthritis.   Direct injury to the back.  DIAGNOSIS Most of the time, the direct cause of low back pain is not known.However, back pain can be treated effectively even when the exact cause of the pain is unknown.Answering your caregiver's questions about your overall health and symptoms is one of the most accurate ways to make sure the cause of your pain is not dangerous. If your caregiver needs more information, he or she may order lab work or imaging tests (X-rays or MRIs).However, even if imaging tests show changes in your back, this usually does not require surgery. HOME CARE INSTRUCTIONS For many people, back pain  returns.Since low back pain is rarely dangerous, it is often a condition that people can learn to [facility] their own.   Remain active. It is stressful on the back to sit or stand in one place. Do not sit, drive, or stand in one place for more than 30 minutes at a time. Take short walks on level surfaces as soon as pain allows.Try to increase the length of time you walk each day.   Do not stay in bed.Resting more than 1 or 2 days can delay your recovery.   Do not avoid exercise or work.Your body is made to move.It is not dangerous to be active, even though your back may hurt.Your back will likely heal faster if you return to being active before your pain is gone.   Pay attention to your body when you bend and lift. Many people have less discomfortwhen lifting if they bend their knees, keep the load close to their bodies,and avoid twisting. Often, the most comfortable positions are those that put less stress on your recovering back.   Find a comfortable position to sleep. Use a firm mattress and lie on your side with your knees slightly bent. If you lie on your back, put a pillow under your knees.   Only take over-the-counter or prescription medicines as directed by your caregiver. Over-the-counter medicines to reduce pain and inflammation are often the most helpful.Your caregiver may prescribe muscle relaxant drugs.These medicines help dull your pain so you can more quickly return to your normal activities and healthy exercise.   Put ice on the injured area.   Put ice in a plastic bag.   Place a towel between your skin and the bag.   Leave the ice on for 15 to 20 minutes, 3 to 4 times a day for the first 2 to 3 days. After that, ice and heat may be alternated to reduce pain and spasms.   Ask your caregiver about trying back exercises and gentle massage. This may be of some benefit.   Avoid feeling anxious or stressed.Stress increases muscle tension and can worsen back pain.It is  important to recognize when you are anxious or stressed and learn ways to manage it.Exercise is a great option.  SEEK MEDICAL CARE IF:  You have pain that is not relieved with rest or medicine.   You have pain that does not improve in 1 week.   You have new symptoms.   You are generally not feeling well.  SEEK IMMEDIATE MEDICAL CARE IF:   You have pain that radiates from your back into your legs.   You develop new bowel or bladder control problems.   You have unusual weakness or  numbness in your arms or legs.   You develop nausea or vomiting.   You develop abdominal pain.   You feel faint.  Document Released: 05/13/2005 Document Revised: 05/02/2011 Document Reviewed: 10/01/2010 [facility] Patient Information 2012 [place], [place].

## 2011-08-18 NOTE — ED Provider Notes (Signed)
History     CSN: [id number]  Arrival date & time 08/15/11  2353   First MD Initiated Contact with Patient 08/16/11 0248      Chief Complaint  Patient presents with  . Back Pain    (Consider location/radiation/quality/duration/timing/severity/associated sxs/prior treatment) Patient is a 66 y.o. female presenting with back pain. The history is provided by the patient and the spouse.  Back Pain  This is a chronic problem. The current episode started more than 1 week ago. The problem occurs constantly. The problem has not changed since onset.The pain is associated with no known injury. The pain is present in the lumbar spine. The quality of the pain is described as aching. The pain radiates to the left thigh and right thigh. The pain is moderate. The symptoms are aggravated by bending, twisting and certain positions. The pain is the same all the time. Stiffness is present all day. Pertinent negatives include no chest pain, no fever, no numbness, no weight loss, no headaches, no abdominal pain, no abdominal swelling, no bowel incontinence, no perianal numbness, no bladder incontinence, no dysuria, no pelvic pain, no leg pain, no paresthesias, no paresis, no tingling and no weakness. She has tried NSAIDs and analgesics for the symptoms. The treatment provided moderate relief. Risk factors include lack of exercise.    Past Medical History  Diagnosis Date  . Spondylolisthesis   . Lumbar stenosis   . Lumbar radiculopathy   . Diabetes mellitus     Diet controlled  . Depression   . Sleep apnea   . Parkinson disease   . CVA (cerebrovascular accident)   . Asthma   . Hemangioma   . Hypertension     Past Surgical History  Procedure Date  . Laminectomy   . Cardiac catheterization 2003     Angiographically patent coronary arteries with luminal irregularities in mid left anterior descending coronary artery and mid right coronary  artery. Normal left ventricular systolic function, ejection  fraction estimate of  60 to 65% Normal abdominal aorta.   Normal renal arteries.  . Abdominal hysterectomy   . Foot surgery   . Anal fissure surgery   . Appendectomy     Family History  Problem Relation Age of Onset  . Coronary artery disease      History  Substance Use Topics  . Smoking status: Never Smoker   . Smokeless tobacco: Not on file  . Alcohol Use: No    OB History    Grav Para Term Preterm Abortions TAB SAB Ect Mult Living                  Review of Systems  Constitutional: Negative for fever, weight loss, activity change and fatigue.  HENT: Negative for neck pain and neck stiffness.   Respiratory: Negative.  Negative for shortness of breath.   Cardiovascular: Negative.  Negative for chest pain.  Gastrointestinal: Negative for nausea, vomiting, abdominal pain, abdominal distention and bowel incontinence.  Genitourinary: Negative for bladder incontinence, dysuria, flank pain, difficulty urinating and pelvic pain.  Musculoskeletal: Positive for myalgias and back pain. Negative for joint swelling and gait problem.  Skin: Negative for color change and rash.  Neurological: Negative for tingling, weakness, numbness, headaches and paresthesias.  Psychiatric/Behavioral: Negative.     Allergies  Estrogens; Hydromorphone; Tramadol; Vicodin; and Neurontin  Home Medications   Current Outpatient Rx  Name Route Sig Dispense Refill  . ALPRAZOLAM 1 MG PO TABS Oral Take 1 mg by mouth as needed.      Marland Kitchen  AMLODIPINE BESYLATE 10 MG PO TABS Oral Take 10 mg by mouth daily.      . ATENOLOL 100 MG PO TABS Oral Take 100 mg by mouth daily.      Marland Kitchen CARBIDOPA-LEVODOPA-ENTACAPONE 37.5-150-200 MG PO TABS Oral Take 1 tablet by mouth 4 (four) times daily. Take at 0800, 1200, 1600, 2000    . CLONIDINE HCL 0.3 MG PO TABS Oral Take 0.3 mg by mouth daily.     . FUROSEMIDE 20 MG PO TABS Oral Take 20 mg by mouth daily.      Marland Kitchen HYDROCODONE-ACETAMINOPHEN 5-325 MG PO TABS Oral Take 1 tablet by mouth  every 6 (six) hours as needed. For pain    . IPRATROPIUM BROMIDE 0.06 % NA SOLN      . LIDODERM 5 % EX PTCH Transdermal Place 1 patch onto the skin daily.     Marland Kitchen LOSARTAN POTASSIUM 100 MG PO TABS Oral Take 100 mg by mouth daily.      Marland Kitchen NAPROXEN 500 MG PO TABS Oral Take 500 mg by mouth daily.     Marland Kitchen NITROFURANTOIN MACROCRYSTAL 100 MG PO CAPS      . PHILLIPS COLON HEALTH PO Oral Take 1 capsule by mouth daily.    . VENLAFAXINE HCL 75 MG PO TABS Oral Take 75 mg by mouth daily.       BP 175/88  Pulse 50  Temp(Src) 98.7 F (37.1 C) (Oral)  Resp 14  SpO2 98%  Physical Exam  Nursing note and vitals reviewed. Constitutional: She is oriented to person, place, and time. She appears well-developed and well-nourished. No distress.  HENT:  Head: Normocephalic and atraumatic.  Mouth/Throat: Oropharynx is clear and moist.  Eyes: EOM are normal. Pupils are equal, round, and reactive to light.  Neck: Trachea normal, normal range of motion, full passive range of motion without pain and phonation normal. Neck supple. No spinous process tenderness and no muscular tenderness present. No rigidity. Normal range of motion present.  Cardiovascular: Normal rate, regular rhythm, normal heart sounds and intact distal pulses.  Exam reveals no gallop and no friction rub.   No murmur heard. Pulmonary/Chest: Effort normal and breath sounds normal. No respiratory distress. She has no wheezes. She has no rales. She exhibits no tenderness.  Abdominal: Soft. Bowel sounds are normal. She exhibits no distension, no fluid wave, no ascites and no mass. There is no tenderness. There is no rebound, no guarding and no CVA tenderness.  Musculoskeletal: Normal range of motion. She exhibits tenderness. She exhibits no edema.       Cervical back: Normal.       Thoracic back: Normal.       Lumbar back: She exhibits tenderness, bony tenderness, pain and spasm. She exhibits no swelling, no edema, no deformity and no laceration.    Neurological: She is alert and oriented to person, place, and time. No cranial nerve deficit or sensory deficit. She exhibits normal muscle tone. Coordination normal. GCS eye subscore is 4. GCS verbal subscore is 5. GCS motor subscore is 6.  Skin: Skin is warm and dry. No rash noted. She is not diaphoretic.  Psychiatric: She has a normal mood and affect.    ED Course  Procedures (including critical care time)  Labs Reviewed  URINALYSIS, ROUTINE W REFLEX MICROSCOPIC - Abnormal; Notable for the following:    Protein, ur 100 (*)    All other components within normal limits  CBC - Abnormal; Notable for the following:    MCHC  36.1 (*)    All other components within normal limits  DIFFERENTIAL - Abnormal; Notable for the following:    Neutrophils Relative 40 (*)    Lymphocytes Relative 47 (*)    All other components within normal limits  BASIC METABOLIC PANEL  URINE MICROSCOPIC-ADD ON  LAB REPORT - SCANNED   No results found.   1. Chronic back pain   2. Osteoarthritis       MDM  The patient has a history of chonic back pain for which her current back pain is unchanged with mild worsening in severity earlier today but at this time controlled.  The patient has an upcoming MRI scheduled for further evaluation of this, has had no recent injury, and requests that no further imaging be performed at this time.  She does not request further analgesics to be prescribed, stating that she has plenty at home.  As her symptoms at this time are unchanged, there has been no recent injury, and her pain controlled on outpatient analegesics, with upcoming imaging to be performed, and with her request that no new imaging be performed at this time, it is uncertain why she has presented to the ED tonight.  The patient requests to take her own previously prescribed pain medication as directed by her prescriber and to be discharged home.          Felisa Bonier, MD 08/18/11 2229

## 2011-08-19 ENCOUNTER — Ambulatory Visit (HOSPITAL_COMMUNITY)
Admission: RE | Admit: 2011-08-19 | Discharge: 2011-08-19 | Disposition: A | Discharge status: Home / Self Care | Payer: BC Managed Care – PPO | Source: Ambulatory Visit | Attending: Neurosurgery | Admitting: Neurosurgery

## 2011-08-19 ENCOUNTER — Encounter (HOSPITAL_COMMUNITY): Payer: Self-pay

## 2011-08-19 DIAGNOSIS — M5126 Other intervertebral disc displacement, lumbar region: Secondary | ICD-10-CM | POA: Insufficient documentation

## 2011-08-19 DIAGNOSIS — M48061 Spinal stenosis, lumbar region without neurogenic claudication: Secondary | ICD-10-CM

## 2011-08-19 DIAGNOSIS — R209 Unspecified disturbances of skin sensation: Secondary | ICD-10-CM | POA: Insufficient documentation

## 2011-08-19 DIAGNOSIS — M5137 Other intervertebral disc degeneration, lumbosacral region: Secondary | ICD-10-CM | POA: Insufficient documentation

## 2011-08-19 DIAGNOSIS — M51379 Other intervertebral disc degeneration, lumbosacral region without mention of lumbar back pain or lower extremity pain: Secondary | ICD-10-CM | POA: Insufficient documentation

## 2011-08-19 DIAGNOSIS — M4310 Spondylolisthesis, site unspecified: Secondary | ICD-10-CM

## 2011-08-19 MED ORDER — GADOBENATE DIMEGLUMINE 529 MG/ML IV SOLN
12.0000 mL | Freq: Once | INTRAVENOUS | Status: AC
  Administered 2011-08-19: 12 mL via INTRAVENOUS

## 2011-08-19 MED ORDER — MIDAZOLAM HCL 2 MG/2ML IJ SOLN
INTRAMUSCULAR | Status: AC
  Filled 2011-08-19: qty 10

## 2011-08-19 MED ORDER — FENTANYL CITRATE 0.05 MG/ML IJ SOLN
25.0000 ug | INTRAMUSCULAR | Status: DC | PRN
  Administered 2011-08-19 (×2): 50 ug via INTRAVENOUS
  Filled 2011-08-19: qty 4

## 2011-08-19 MED ORDER — MIDAZOLAM HCL 2 MG/2ML IJ SOLN
1.0000 mg | INTRAMUSCULAR | Status: DC | PRN
  Administered 2011-08-19 (×2): 1 mg via INTRAVENOUS
  Filled 2011-08-19: qty 10

## 2011-08-19 MED ORDER — FENTANYL CITRATE 0.05 MG/ML IJ SOLN
INTRAMUSCULAR | Status: AC
  Filled 2011-08-19: qty 4

## 2011-08-19 NOTE — H&P (Signed)
[name] is an 66 y.o. female.   Chief Complaint: Back pain HPI: scheduled for Lumbar MRI with sedation  Past Medical History  Diagnosis Date  . Spondylolisthesis   . Lumbar stenosis   . Lumbar radiculopathy   . Diabetes mellitus     Diet controlled  . Depression   . Sleep apnea   . Parkinson disease   . CVA (cerebrovascular accident)   . Asthma   . Hemangioma   . Hypertension     Past Surgical History  Procedure Date  . Laminectomy   . Cardiac catheterization 2003     Angiographically patent coronary arteries with luminal irregularities in mid left anterior descending coronary artery and mid right coronary  artery. Normal left ventricular systolic function, ejection fraction estimate of  60 to 65% Normal abdominal aorta.   Normal renal arteries.  . Abdominal hysterectomy   . Foot surgery   . Anal fissure surgery   . Appendectomy     Family History  Problem Relation Age of Onset  . Coronary artery disease     Social History:  reports that she has never smoked. She does not have any smokeless tobacco history on file. She reports that she does not drink alcohol or use illicit drugs.  Allergies:  Allergies  Allergen Reactions  . Estrogens   . Hydromorphone   . Tramadol     Unsure of reaction per patient  . Vicodin (Hydrocodone-Acetaminophen)   . Neurontin (Gabapentin) Other (See Comments)    Urinary problems    Medications Prior to Admission  Medication Sig Dispense Refill  . ALPRAZolam (XANAX) 1 MG tablet Take 1 mg by mouth as needed.        Marland Kitchen amLODipine (NORVASC) 10 MG tablet Take 10 mg by mouth daily.        Marland Kitchen atenolol (TENORMIN) 100 MG tablet Take 100 mg by mouth daily.        . carbidopa-levodopa-entacapone (STALEVO) 37.5-150-200 MG per tablet Take 1 tablet by mouth 4 (four) times daily. Take at 0800, 1200, 1600, 2000      . furosemide (LASIX) 20 MG tablet Take 20 mg by mouth daily.        Marland Kitchen HYDROcodone-acetaminophen (NORCO) 5-325 MG per tablet Take 1  tablet by mouth every 6 (six) hours as needed. For pain      . ipratropium (ATROVENT) 0.06 % nasal spray       . LIDODERM 5 % Place 1 patch onto the skin daily.       Marland Kitchen losartan (COZAAR) 100 MG tablet Take 100 mg by mouth daily.        . naproxen (NAPROSYN) 500 MG tablet Take 500 mg by mouth daily.       . nitrofurantoin (MACRODANTIN) 100 MG capsule       . Probiotic Product (PHILLIPS COLON HEALTH PO) Take 1 capsule by mouth daily.      Marland Kitchen venlafaxine (EFFEXOR) 75 MG tablet Take 75 mg by mouth daily.       . cloNIDine (CATAPRES) 0.3 MG tablet Take 0.3 mg by mouth daily.        No current facility-administered medications on file as of 08/19/2011.    No results found for this or any previous visit (from the past 48 hour(s)). No results found.  Review of Systems  Constitutional: Negative for fever.  Cardiovascular: Negative for chest pain.  Gastrointestinal: Negative for nausea, vomiting and abdominal pain.  Musculoskeletal: Positive for back pain.  Blood pressure 176/86, pulse 54, temperature 97.6 F (36.4 C), temperature source Oral, resp. rate 14, height 5' (1.524 m), weight 120 lb (54.432 kg), SpO2 99.00%. Physical Exam  Constitutional: She is oriented to person, place, and time. She appears well-developed and well-nourished.  Cardiovascular: Normal rate, regular rhythm and normal heart sounds.   No murmur heard. Respiratory: Effort normal and breath sounds normal. She has no wheezes.  GI: Soft. Bowel sounds are normal. There is no tenderness.  Neurological: She is alert and oriented to person, place, and time.     Assessment/Plan Back pain MRI with sedation Pt agreeable to proceed. Consent signed  Conner Neiss A 08/19/2011, 11:19 AM

## 2011-10-03 ENCOUNTER — Other Ambulatory Visit: Payer: Self-pay | Admitting: Neurosurgery

## 2011-10-10 ENCOUNTER — Encounter (HOSPITAL_COMMUNITY): Payer: Self-pay | Admitting: Pharmacy Technician

## 2011-10-14 ENCOUNTER — Inpatient Hospital Stay (HOSPITAL_COMMUNITY): Admission: RE | Admit: 2011-10-14 | Discharge: 2011-10-14 | Payer: BC Managed Care – PPO | Source: Ambulatory Visit

## 2011-10-14 ENCOUNTER — Encounter (HOSPITAL_COMMUNITY): Payer: Self-pay

## 2011-10-14 HISTORY — DX: Inflammatory liver disease, unspecified: K75.9

## 2011-10-14 HISTORY — DX: Anxiety disorder, unspecified: F41.9

## 2011-10-14 HISTORY — DX: Gastro-esophageal reflux disease without esophagitis: K21.9

## 2011-10-14 HISTORY — DX: Encounter for other specified aftercare: Z51.89

## 2011-10-14 HISTORY — DX: Urinary tract infection, site not specified: N39.0

## 2011-10-16 NOTE — Pre-Procedure Instructions (Addendum)
20 [name]  10/16/2011   Your procedure is scheduled on:  Wednesday Oct 23, 2011.   Report to [facility] at 6:30AM.  Call this number if you have problems the morning of surgery: [phone]   Remember:   Do not eat food:After Midnight.  May have clear liquids: up to 4 Hours before arrival unti 2:30 am.  Clear liquids include soda, tea, black coffee, apple or grape juice, broth.  Take these medicines the morning of surgery with A SIP OF WATER: Alprazolam (Xanax), Amlodipine (Norvasc), Atenolol (Tenormin), Carbidopa (Stalevo), Clonidine (Catapres), Hydrocodone (Norco) if needed for pain, and Venlafaxine (Effexor).   Do not wear jewelry, make-up or nail polish.  Do not wear lotions, powders, or perfumes. You may wear deodorant.  Do not shave 48 hours prior to surgery. Men may shave face and neck.  Do not bring valuables to the hospital.  Contacts, dentures or bridgework may not be worn into surgery.  Leave suitcase in the car. After surgery it may be brought to your room.  For patients admitted to the hospital, checkout time is 11:00 AM the day of discharge.   Patients discharged the day of surgery will not be allowed to drive home.  Name and phone number of your driver:   Special Instructions: CHG Shower Use Special Wash: 1/2 bottle night before surgery and 1/2 bottle morning of surgery.   Please read over the following fact sheets that you were given: Pain Booklet, Coughing and Deep Breathing, Blood Transfusion Information, MRSA Information and Surgical Site Infection Prevention

## 2011-10-17 ENCOUNTER — Encounter (HOSPITAL_COMMUNITY)
Admission: RE | Admit: 2011-10-17 | Discharge: 2011-10-17 | Disposition: A | Discharge status: Home / Self Care | Payer: BC Managed Care – PPO | Source: Ambulatory Visit | Attending: Neurosurgery | Admitting: Neurosurgery

## 2011-10-17 LAB — COMPREHENSIVE METABOLIC PANEL
ALT: <5 U/L (ref 0–35)
AST: 18 U/L (ref 0–37)
Albumin: 4 g/dL (ref 3.5–5.2)
CO2: 27 mEq/L (ref 19–32)
Calcium: 9.5 mg/dL (ref 8.4–10.5)
Creatinine, Ser: 0.66 mg/dL (ref 0.50–1.10)
GFR calc non Af Amer: >90 mL/min (ref >90)
Sodium: 140 mEq/L (ref 135–145)
Total Protein: 7 g/dL (ref 6.0–8.3)

## 2011-10-17 LAB — CBC
MCH: 32.8 pg (ref 26.0–34.0)
MCV: 94.6 fL (ref 78.0–100.0)
Platelets: 175 10*3/uL (ref 150–400)
RBC: 3.9 MIL/uL (ref 3.87–5.11)
RDW: 12.2 % (ref 11.5–15.5)

## 2011-10-17 LAB — TYPE AND SCREEN: Antibody Screen: NEGATIVE

## 2011-10-23 ENCOUNTER — Encounter (HOSPITAL_COMMUNITY)
Admission: RE | Disposition: A | Discharge status: Home / Self Care | Payer: Self-pay | Source: Ambulatory Visit | Attending: Neurosurgery

## 2011-10-23 ENCOUNTER — Ambulatory Visit (HOSPITAL_COMMUNITY): Payer: BC Managed Care – PPO | Admitting: *Deleted

## 2011-10-23 ENCOUNTER — Encounter (HOSPITAL_COMMUNITY): Payer: Self-pay | Admitting: *Deleted

## 2011-10-23 ENCOUNTER — Encounter (HOSPITAL_COMMUNITY): Payer: Self-pay | Admitting: General Practice

## 2011-10-23 ENCOUNTER — Inpatient Hospital Stay (HOSPITAL_COMMUNITY)
Admission: RE | Admit: 2011-10-23 | Discharge: 2011-10-25 | DRG: 755 | Disposition: A | Discharge status: Home / Self Care | Payer: BC Managed Care – PPO | Source: Ambulatory Visit | Attending: Neurosurgery | Admitting: Neurosurgery

## 2011-10-23 ENCOUNTER — Ambulatory Visit (HOSPITAL_COMMUNITY): Payer: BC Managed Care – PPO

## 2011-10-23 DIAGNOSIS — Z90710 Acquired absence of both cervix and uterus: Secondary | ICD-10-CM

## 2011-10-23 DIAGNOSIS — M4716 Other spondylosis with myelopathy, lumbar region: Secondary | ICD-10-CM | POA: Diagnosis present

## 2011-10-23 DIAGNOSIS — F411 Generalized anxiety disorder: Secondary | ICD-10-CM | POA: Diagnosis present

## 2011-10-23 DIAGNOSIS — G450 Vertebro-basilar artery syndrome: Secondary | ICD-10-CM | POA: Diagnosis present

## 2011-10-23 DIAGNOSIS — Z9089 Acquired absence of other organs: Secondary | ICD-10-CM

## 2011-10-23 DIAGNOSIS — I10 Essential (primary) hypertension: Secondary | ICD-10-CM | POA: Diagnosis present

## 2011-10-23 DIAGNOSIS — Z8673 Personal history of transient ischemic attack (TIA), and cerebral infarction without residual deficits: Secondary | ICD-10-CM

## 2011-10-23 DIAGNOSIS — G20 Parkinson's disease: Secondary | ICD-10-CM | POA: Diagnosis present

## 2011-10-23 DIAGNOSIS — M5106 Intervertebral disc disorders with myelopathy, lumbar region: Secondary | ICD-10-CM | POA: Diagnosis present

## 2011-10-23 DIAGNOSIS — F329 Major depressive disorder, single episode, unspecified: Secondary | ICD-10-CM | POA: Diagnosis present

## 2011-10-23 DIAGNOSIS — M48062 Spinal stenosis, lumbar region with neurogenic claudication: Principal | ICD-10-CM

## 2011-10-23 DIAGNOSIS — G20A1 Parkinson's disease without dyskinesia, without mention of fluctuations: Secondary | ICD-10-CM | POA: Diagnosis present

## 2011-10-23 DIAGNOSIS — F3289 Other specified depressive episodes: Secondary | ICD-10-CM | POA: Diagnosis present

## 2011-10-23 DIAGNOSIS — G4730 Sleep apnea, unspecified: Secondary | ICD-10-CM | POA: Diagnosis present

## 2011-10-23 DIAGNOSIS — Z888 Allergy status to other drugs, medicaments and biological substances status: Secondary | ICD-10-CM

## 2011-10-23 DIAGNOSIS — Z885 Allergy status to narcotic agent status: Secondary | ICD-10-CM

## 2011-10-23 DIAGNOSIS — Z79899 Other long term (current) drug therapy: Secondary | ICD-10-CM

## 2011-10-23 DIAGNOSIS — J45909 Unspecified asthma, uncomplicated: Secondary | ICD-10-CM | POA: Diagnosis present

## 2011-10-23 DIAGNOSIS — K219 Gastro-esophageal reflux disease without esophagitis: Secondary | ICD-10-CM | POA: Diagnosis present

## 2011-10-23 HISTORY — PX: LUMBAR FUSION: SHX111

## 2011-10-23 LAB — GLUCOSE, CAPILLARY

## 2011-10-23 SURGERY — POSTERIOR LUMBAR FUSION 1 WITH HARDWARE REMOVAL
Anesthesia: General | Site: Spine Lumbar

## 2011-10-23 MED ORDER — FENTANYL CITRATE 0.05 MG/ML IJ SOLN
25.0000 ug | INTRAMUSCULAR | Status: DC | PRN
  Administered 2011-10-23: 25 ug via INTRAVENOUS

## 2011-10-23 MED ORDER — IPRATROPIUM BROMIDE 0.06 % NA SOLN
2.0000 | Freq: Three times a day (TID) | NASAL | Status: DC
  Administered 2011-10-23 – 2011-10-25 (×2): 2 via NASAL
  Filled 2011-10-23: qty 15

## 2011-10-23 MED ORDER — DIPHENHYDRAMINE HCL 12.5 MG/5ML PO ELIX: 12.5000 mg | ORAL_SOLUTION | Freq: Four times a day (QID) | ORAL | Status: DC | PRN

## 2011-10-23 MED ORDER — LACTATED RINGERS IV SOLN
INTRAVENOUS | Status: DC | PRN
  Administered 2011-10-23 (×3): via INTRAVENOUS

## 2011-10-23 MED ORDER — ONDANSETRON HCL 4 MG/2ML IJ SOLN: 4.0000 mg | Freq: Once | INTRAMUSCULAR | Status: DC | PRN

## 2011-10-23 MED ORDER — ONDANSETRON HCL 4 MG/2ML IJ SOLN
INTRAMUSCULAR | Status: DC | PRN
  Administered 2011-10-23: 4 mg via INTRAVENOUS

## 2011-10-23 MED ORDER — ZOLPIDEM TARTRATE 5 MG PO TABS
5.0000 mg | ORAL_TABLET | Freq: Every evening | ORAL | Status: DC | PRN
  Administered 2011-10-23 – 2011-10-25 (×2): 5 mg via ORAL
  Filled 2011-10-23 (×2): qty 1

## 2011-10-23 MED ORDER — MIDAZOLAM HCL 5 MG/5ML IJ SOLN
INTRAMUSCULAR | Status: DC | PRN
  Administered 2011-10-23: 2 mg via INTRAVENOUS

## 2011-10-23 MED ORDER — ENTACAPONE 200 MG PO TABS
200.0000 mg | ORAL_TABLET | Freq: Four times a day (QID) | ORAL | Status: DC
  Administered 2011-10-23 – 2011-10-24 (×2): 200 mg via ORAL
  Filled 2011-10-23 (×7): qty 1

## 2011-10-23 MED ORDER — ROCURONIUM BROMIDE 100 MG/10ML IV SOLN
INTRAVENOUS | Status: DC | PRN
  Administered 2011-10-23: 5 mg via INTRAVENOUS
  Administered 2011-10-23: 50 mg via INTRAVENOUS

## 2011-10-23 MED ORDER — LACTATED RINGERS IV SOLN
INTRAVENOUS | Status: DC
  Administered 2011-10-23 – 2011-10-24 (×2): via INTRAVENOUS
  Administered 2011-10-24: 75 mL/h via INTRAVENOUS

## 2011-10-23 MED ORDER — SODIUM CHLORIDE 0.9 % IV SOLN
INTRAVENOUS | Status: AC
  Filled 2011-10-23: qty 500

## 2011-10-23 MED ORDER — FUROSEMIDE 10 MG/ML IJ SOLN
INTRAMUSCULAR | Status: DC | PRN
  Administered 2011-10-23: 10 mg via INTRAMUSCULAR

## 2011-10-23 MED ORDER — BACITRACIN ZINC 500 UNIT/GM EX OINT
TOPICAL_OINTMENT | CUTANEOUS | Status: DC | PRN
  Administered 2011-10-23: 1 via TOPICAL

## 2011-10-23 MED ORDER — CLONIDINE HCL 0.3 MG PO TABS
0.3000 mg | ORAL_TABLET | Freq: Every day | ORAL | Status: DC
  Administered 2011-10-24 – 2011-10-25 (×2): 0.3 mg via ORAL
  Filled 2011-10-23 (×2): qty 1

## 2011-10-23 MED ORDER — ACETAMINOPHEN 650 MG RE SUPP: 650.0000 mg | RECTAL | Status: DC | PRN

## 2011-10-23 MED ORDER — NEOSTIGMINE METHYLSULFATE 1 MG/ML IJ SOLN
INTRAMUSCULAR | Status: DC | PRN
  Administered 2011-10-23: 3 mg via INTRAVENOUS

## 2011-10-23 MED ORDER — ONDANSETRON HCL 4 MG/2ML IJ SOLN: 4.0000 mg | INTRAMUSCULAR | Status: DC | PRN

## 2011-10-23 MED ORDER — PROPOFOL 10 MG/ML IV EMUL
INTRAVENOUS | Status: DC | PRN
  Administered 2011-10-23: 120 mg via INTRAVENOUS

## 2011-10-23 MED ORDER — FENTANYL CITRATE 0.05 MG/ML IJ SOLN
INTRAMUSCULAR | Status: DC | PRN
  Administered 2011-10-23 (×2): 50 ug via INTRAVENOUS
  Administered 2011-10-23: 25 ug via INTRAVENOUS
  Administered 2011-10-23: 100 ug via INTRAVENOUS
  Administered 2011-10-23: 25 ug via INTRAVENOUS
  Administered 2011-10-23: 50 ug via INTRAVENOUS
  Administered 2011-10-23 (×2): 25 ug via INTRAVENOUS

## 2011-10-23 MED ORDER — GLYCOPYRROLATE 0.2 MG/ML IJ SOLN
INTRAMUSCULAR | Status: DC | PRN
  Administered 2011-10-23: .5 mg via INTRAVENOUS

## 2011-10-23 MED ORDER — DOCUSATE SODIUM 100 MG PO CAPS
100.0000 mg | ORAL_CAPSULE | Freq: Two times a day (BID) | ORAL | Status: DC
  Administered 2011-10-23 – 2011-10-25 (×4): 100 mg via ORAL
  Filled 2011-10-23 (×3): qty 1

## 2011-10-23 MED ORDER — PHENOL 1.4 % MT LIQD: 1.0000 | OROMUCOSAL | Status: DC | PRN

## 2011-10-23 MED ORDER — MORPHINE SULFATE (PF) 1 MG/ML IV SOLN
INTRAVENOUS | Status: DC
  Administered 2011-10-23: 15:00:00 via INTRAVENOUS
  Filled 2011-10-23: qty 25

## 2011-10-23 MED ORDER — FUROSEMIDE 20 MG PO TABS
20.0000 mg | ORAL_TABLET | Freq: Every day | ORAL | Status: DC
  Administered 2011-10-23 – 2011-10-25 (×3): 20 mg via ORAL
  Filled 2011-10-23 (×3): qty 1

## 2011-10-23 MED ORDER — ALBUMIN HUMAN 5 % IV SOLN
INTRAVENOUS | Status: DC | PRN
  Administered 2011-10-23: 11:00:00 via INTRAVENOUS

## 2011-10-23 MED ORDER — BACITRACIN 50000 UNITS IM SOLR
INTRAMUSCULAR | Status: AC
  Filled 2011-10-23: qty 1

## 2011-10-23 MED ORDER — CEFAZOLIN SODIUM 1-5 GM-% IV SOLN
1.0000 g | Freq: Three times a day (TID) | INTRAVENOUS | Status: AC
  Administered 2011-10-23 – 2011-10-24 (×2): 1 g via INTRAVENOUS
  Filled 2011-10-23 (×3): qty 50

## 2011-10-23 MED ORDER — BUPIVACAINE-EPINEPHRINE PF 0.5-1:200000 % IJ SOLN
INTRAMUSCULAR | Status: DC | PRN
  Administered 2011-10-23: 10 mL

## 2011-10-23 MED ORDER — LOSARTAN POTASSIUM 50 MG PO TABS
100.0000 mg | ORAL_TABLET | Freq: Every day | ORAL | Status: DC
  Administered 2011-10-23 – 2011-10-25 (×3): 100 mg via ORAL
  Filled 2011-10-23 (×3): qty 2

## 2011-10-23 MED ORDER — LIDOCAINE HCL (CARDIAC) 20 MG/ML IV SOLN
INTRAVENOUS | Status: DC | PRN
  Administered 2011-10-23: 100 mg via INTRAVENOUS

## 2011-10-23 MED ORDER — 0.9 % SODIUM CHLORIDE (POUR BTL) OPTIME
TOPICAL | Status: DC | PRN
  Administered 2011-10-23: 1000 mL

## 2011-10-23 MED ORDER — VENLAFAXINE HCL 75 MG PO TABS
75.0000 mg | ORAL_TABLET | Freq: Every day | ORAL | Status: DC
  Administered 2011-10-24 – 2011-10-25 (×2): 75 mg via ORAL
  Filled 2011-10-23 (×2): qty 1

## 2011-10-23 MED ORDER — ONDANSETRON HCL 4 MG/2ML IJ SOLN: 4.0000 mg | Freq: Four times a day (QID) | INTRAMUSCULAR | Status: DC | PRN

## 2011-10-23 MED ORDER — MENTHOL 3 MG MT LOZG: 1.0000 | LOZENGE | OROMUCOSAL | Status: DC | PRN

## 2011-10-23 MED ORDER — AMLODIPINE BESYLATE 10 MG PO TABS
10.0000 mg | ORAL_TABLET | Freq: Every day | ORAL | Status: DC
  Administered 2011-10-24 – 2011-10-25 (×2): 10 mg via ORAL
  Filled 2011-10-23 (×2): qty 1

## 2011-10-23 MED ORDER — SODIUM CHLORIDE 0.9 % IJ SOLN: 9.0000 mL | INTRAMUSCULAR | Status: DC | PRN

## 2011-10-23 MED ORDER — OXYCODONE-ACETAMINOPHEN 5-325 MG PO TABS
ORAL_TABLET | ORAL | Status: AC
  Filled 2011-10-23: qty 2

## 2011-10-23 MED ORDER — ACETAMINOPHEN 325 MG PO TABS: 650.0000 mg | ORAL_TABLET | ORAL | Status: DC | PRN

## 2011-10-23 MED ORDER — CARBIDOPA-LEVODOPA 25-100 MG PO TABS
1.5000 | ORAL_TABLET | Freq: Three times a day (TID) | ORAL | Status: DC
  Administered 2011-10-23 – 2011-10-24 (×3): 1.5 via ORAL
  Filled 2011-10-23 (×7): qty 1.5

## 2011-10-23 MED ORDER — HYDROCODONE-ACETAMINOPHEN 5-325 MG PO TABS
1.0000 | ORAL_TABLET | ORAL | Status: DC | PRN
  Administered 2011-10-24: 2 via ORAL
  Administered 2011-10-24: 1 via ORAL
  Administered 2011-10-25: 2 via ORAL
  Filled 2011-10-23: qty 1
  Filled 2011-10-23: qty 2

## 2011-10-23 MED ORDER — OXYCODONE-ACETAMINOPHEN 5-325 MG PO TABS
1.0000 | ORAL_TABLET | ORAL | Status: DC | PRN
  Administered 2011-10-23 – 2011-10-25 (×2): 2 via ORAL
  Filled 2011-10-23: qty 2

## 2011-10-23 MED ORDER — DIAZEPAM 5 MG PO TABS
5.0000 mg | ORAL_TABLET | Freq: Four times a day (QID) | ORAL | Status: DC | PRN
  Administered 2011-10-23 – 2011-10-25 (×2): 5 mg via ORAL
  Filled 2011-10-23: qty 1

## 2011-10-23 MED ORDER — NALOXONE HCL 0.4 MG/ML IJ SOLN: 0.4000 mg | INTRAMUSCULAR | Status: DC | PRN

## 2011-10-23 MED ORDER — SODIUM CHLORIDE 0.9 % IR SOLN
Status: DC | PRN
  Administered 2011-10-23: 10:00:00

## 2011-10-23 MED ORDER — FENTANYL CITRATE 0.05 MG/ML IJ SOLN
INTRAMUSCULAR | Status: AC
  Filled 2011-10-23: qty 2

## 2011-10-23 MED ORDER — CEFAZOLIN SODIUM-DEXTROSE 2-3 GM-% IV SOLR
INTRAVENOUS | Status: AC
  Administered 2011-10-23: 2 g via INTRAVENOUS
  Filled 2011-10-23: qty 50

## 2011-10-23 MED ORDER — THROMBIN 20000 UNITS EX KIT
PACK | CUTANEOUS | Status: DC | PRN
  Administered 2011-10-23: 10:00:00 via TOPICAL

## 2011-10-23 MED ORDER — HYDROMORPHONE HCL PF 1 MG/ML IJ SOLN: 0.2500 mg | INTRAMUSCULAR | Status: DC | PRN

## 2011-10-23 MED ORDER — BUPIVACAINE LIPOSOME 1.3 % IJ SUSP
20.0000 mL | INTRAMUSCULAR | Status: AC
  Administered 2011-10-23: 20 mL
  Filled 2011-10-23: qty 20

## 2011-10-23 MED ORDER — DIAZEPAM 5 MG PO TABS
ORAL_TABLET | ORAL | Status: AC
  Filled 2011-10-23: qty 1

## 2011-10-23 MED ORDER — ATENOLOL 100 MG PO TABS
100.0000 mg | ORAL_TABLET | Freq: Every day | ORAL | Status: DC
  Administered 2011-10-24 – 2011-10-25 (×2): 100 mg via ORAL
  Filled 2011-10-23 (×2): qty 1

## 2011-10-23 MED ORDER — DIPHENHYDRAMINE HCL 50 MG/ML IJ SOLN: 12.5000 mg | Freq: Four times a day (QID) | INTRAMUSCULAR | Status: DC | PRN

## 2011-10-23 MED ORDER — CARBIDOPA-LEVODOPA-ENTACAPONE 37.5-150-200 MG PO TABS: 1.0000 | ORAL_TABLET | Freq: Four times a day (QID) | ORAL | Status: DC

## 2011-10-23 SURGICAL SUPPLY — 69 items
APL SKNCLS STERI-STRIP NONHPOA (GAUZE/BANDAGES/DRESSINGS) ×1
BAG DECANTER FOR FLEXI CONT (MISCELLANEOUS) ×2 IMPLANT
BENZOIN TINCTURE PRP APPL 2/3 (GAUZE/BANDAGES/DRESSINGS) ×2 IMPLANT
BLADE SURG ROTATE 9660 (MISCELLANEOUS) IMPLANT
BRUSH SCRUB EZ PLAIN DRY (MISCELLANEOUS) ×2 IMPLANT
BUR ACORN 6.0 (BURR) ×2 IMPLANT
BUR MATCHSTICK NEURO 3.0 LAGG (BURR) ×2 IMPLANT
CANISTER SUCTION 2500CC (MISCELLANEOUS) ×2 IMPLANT
CAP REVERE LOCKING (Cap) ×4 IMPLANT
CLAMP L REVERE OFFSET CONN (Clamp) ×1 IMPLANT
CLAMP R REVERE OFFSET CONN (Clamp) ×1 IMPLANT
CLOTH BEACON ORANGE TIMEOUT ST (SAFETY) ×2 IMPLANT
CONT SPEC 4OZ CLIKSEAL STRL BL (MISCELLANEOUS) ×4 IMPLANT
COVER BACK TABLE 24X17X13 BIG (DRAPES) ×1 IMPLANT
DRAPE C-ARM 42X72 X-RAY (DRAPES) ×5 IMPLANT
DRAPE LAPAROTOMY 100X72X124 (DRAPES) ×2 IMPLANT
DRAPE POUCH INSTRU U-SHP 10X18 (DRAPES) ×2 IMPLANT
DRAPE SURG 17X23 STRL (DRAPES) ×8 IMPLANT
ELECT BLADE 4.0 EZ CLEAN MEGAD (MISCELLANEOUS) ×2
ELECT REM PT RETURN 9FT ADLT (ELECTROSURGICAL) ×2
ELECTRODE BLDE 4.0 EZ CLN MEGD (MISCELLANEOUS) ×1 IMPLANT
ELECTRODE REM PT RTRN 9FT ADLT (ELECTROSURGICAL) ×1 IMPLANT
GAUZE SPONGE 4X4 16PLY XRAY LF (GAUZE/BANDAGES/DRESSINGS) ×2 IMPLANT
GLOVE BIO SURGEON STRL SZ8.5 (GLOVE) ×4 IMPLANT
GLOVE BIOGEL M 8.0 STRL (GLOVE) ×1 IMPLANT
GLOVE EXAM NITRILE LRG STRL (GLOVE) IMPLANT
GLOVE EXAM NITRILE MD LF STRL (GLOVE) ×1 IMPLANT
GLOVE EXAM NITRILE XL STR (GLOVE) IMPLANT
GLOVE EXAM NITRILE XS STR PU (GLOVE) IMPLANT
GLOVE INDICATOR 7.0 STRL GRN (GLOVE) ×1 IMPLANT
GLOVE SS BIOGEL STRL SZ 8 (GLOVE) ×2 IMPLANT
GLOVE SUPERSENSE BIOGEL SZ 8 (GLOVE) ×2
GLOVE SURG SS PI 6.5 STRL IVOR (GLOVE) ×4 IMPLANT
GOWN BRE IMP SLV AUR LG STRL (GOWN DISPOSABLE) IMPLANT
GOWN BRE IMP SLV AUR XL STRL (GOWN DISPOSABLE) ×2 IMPLANT
GOWN STRL REIN 2XL LVL4 (GOWN DISPOSABLE) IMPLANT
IMPLANT COROENT LORDOT 8X18X50 ×1 IMPLANT
KIT BASIN OR (CUSTOM PROCEDURE TRAY) ×2 IMPLANT
KIT ROOM TURNOVER OR (KITS) ×2 IMPLANT
MARKER SKIN DUAL TIP RULER LAB (MISCELLANEOUS) ×1 IMPLANT
MILL MEDIUM DISP (BLADE) IMPLANT
NDL HYPO 21X1.5 SAFETY (NEEDLE) IMPLANT
NEEDLE HYPO 21X1.5 SAFETY (NEEDLE) ×2 IMPLANT
NEEDLE HYPO 22GX1.5 SAFETY (NEEDLE) ×2 IMPLANT
NS IRRIG 1000ML POUR BTL (IV SOLUTION) ×2 IMPLANT
PACK FOAM VITOSS 10CC (Orthopedic Implant) ×1 IMPLANT
PACK LAMINECTOMY NEURO (CUSTOM PROCEDURE TRAY) ×2 IMPLANT
PAD ARMBOARD 7.5X6 YLW CONV (MISCELLANEOUS) ×6 IMPLANT
PATTIES SURGICAL .5 X1 (DISPOSABLE) IMPLANT
PATTIES SURGICAL 1X1 (DISPOSABLE) ×1 IMPLANT
PENCIL BUTTON HOLSTER BLD 10FT (ELECTRODE) ×1 IMPLANT
SCREW REVERE 6.5X50MM (Screw) ×2 IMPLANT
SLEEVE SURGEON STRL (DRAPES) ×1 IMPLANT
SPACER SUSTAIN O 10X10X26 (Screw) ×2 IMPLANT
SPONGE GAUZE 4X4 12PLY (GAUZE/BANDAGES/DRESSINGS) ×3 IMPLANT
SPONGE LAP 4X18 X RAY DECT (DISPOSABLE) IMPLANT
SPONGE NEURO XRAY DETECT 1X3 (DISPOSABLE) IMPLANT
SPONGE SURGIFOAM ABS GEL 100 (HEMOSTASIS) ×2 IMPLANT
STRIP CLOSURE SKIN 1/2X4 (GAUZE/BANDAGES/DRESSINGS) ×2 IMPLANT
SUT VIC AB 1 CT1 18XBRD ANBCTR (SUTURE) ×2 IMPLANT
SUT VIC AB 1 CT1 8-18 (SUTURE) ×4
SUT VIC AB 2-0 CP2 18 (SUTURE) ×5 IMPLANT
SYR 20CC LL (SYRINGE) ×1 IMPLANT
SYR 20ML ECCENTRIC (SYRINGE) ×2 IMPLANT
TAPE CLOTH SURG 4X10 WHT LF (GAUZE/BANDAGES/DRESSINGS) ×1 IMPLANT
TOWEL OR 17X24 6PK STRL BLUE (TOWEL DISPOSABLE) ×2 IMPLANT
TOWEL OR 17X26 10 PK STRL BLUE (TOWEL DISPOSABLE) ×2 IMPLANT
TRAY FOLEY CATH 14FRSI W/METER (CATHETERS) ×2 IMPLANT
WATER STERILE IRR 1000ML POUR (IV SOLUTION) ×2 IMPLANT

## 2011-10-23 NOTE — Preoperative (Signed)
Beta Blockers   Reason not to administer Beta Blockers:Not Applicable 

## 2011-10-23 NOTE — Transfer of Care (Signed)
Immediate Anesthesia Transfer of Care Note  Patient: [name]  Procedure(s) Performed: Procedure(s) (LRB): POSTERIOR LUMBAR FUSION 1 WITH HARDWARE REMOVAL (N/A)  Patient Location: PACU  Anesthesia Type: General  Level of Consciousness: sedated  Airway & Oxygen Therapy: Patient Spontanous Breathing and Patient connected to nasal cannula oxygen  Post-op Assessment: Report given to PACU RN and Post -op Vital signs reviewed and stable  Post vital signs: Reviewed and stable  Complications: No apparent anesthesia complications

## 2011-10-23 NOTE — H&P (Signed)
Subjective: The patient is a 66 year old white female who I previously performed at L3-4, L4-5 and L5-S1 decompression instrumentation and fusion. She has initially done well but has developed recurrent back buttocks and leg pain consistent with neurogenic claudication. She has failed medical management and was worked up with a lumbar MRI. This demonstrated patient had severe adjacent degenerative changes and spinal stenosis at L2-3. I discussed the various treatment option with the patient including surgery. She has weighed the risks, benefits, and alternatives surgery and elected proceed with exploration of lumbar fusion as well as an extension of her fusion up to L2-3 . Past Medical History  Diagnosis Date  . Spondylolisthesis   . Lumbar stenosis   . Lumbar radiculopathy   . Depression   . Parkinson disease   . Asthma   . Hemangioma   . Hypertension   . CVA (cerebrovascular accident)     pts lawyer stold her she had a stroke  . Sleep apnea     does not use a cpap machine  . Blood transfusion 1976  . Chronic UTI   . Hepatitis 1973    hepatitis A ?  Marland Kitchen GERD (gastroesophageal reflux disease)   . Anxiety     Past Surgical History  Procedure Date  . Laminectomy   . Cardiac catheterization 2003     Angiographically patent coronary arteries with luminal irregularities in mid left anterior descending coronary artery and mid right coronary  artery. Normal left ventricular systolic function, ejection fraction estimate of  60 to 65% Normal abdominal aorta.   Normal renal arteries.  . Abdominal hysterectomy   . Foot surgery   . Anal fissure surgery   . Appendectomy   . Back surgery     Allergies  Allergen Reactions  . Estrogens   . Tramadol     Unsure of reaction per patient  . Hydromorphone Rash  . Neurontin (Gabapentin) Other (See Comments)    Urinary problems    History  Substance Use Topics  . Smoking status: Never Smoker   . Smokeless tobacco: Not on file  . Alcohol Use:  No    Family History  Problem Relation Age of Onset  . Coronary artery disease    . Anesthesia problems Neg Hx    Prior to Admission medications   Medication Sig Start Date End Date Taking? Authorizing Provider  ALPRAZolam Prudy Feeler) 1 MG tablet Take 1 mg by mouth as needed.     Yes Historical Provider, MD  amLODipine (NORVASC) 10 MG tablet Take 10 mg by mouth daily.     Yes Historical Provider, MD  atenolol (TENORMIN) 100 MG tablet Take 100 mg by mouth daily.     Yes Historical Provider, MD  carbidopa-levodopa-entacapone (STALEVO) 37.5-150-200 MG per tablet Take 1 tablet by mouth 4 (four) times daily. Take at 0800, 1200, 1600, 2000   Yes Historical Provider, MD  cloNIDine (CATAPRES) 0.3 MG tablet Take 0.3 mg by mouth daily.    Yes Historical Provider, MD  furosemide (LASIX) 20 MG tablet Take 20 mg by mouth daily.     Yes Historical Provider, MD  HYDROcodone-acetaminophen (NORCO) 5-325 MG per tablet Take 1 tablet by mouth every 6 (six) hours as needed. For pain   Yes Historical Provider, MD  ipratropium (ATROVENT) 0.06 % nasal spray  08/10/10  Yes Historical Provider, MD  LIDODERM 5 % Place 1 patch onto the skin daily.  05/20/10  Yes Historical Provider, MD  losartan (COZAAR) 100 MG tablet Take 100 mg  by mouth daily.     Yes Historical Provider, MD  nitrofurantoin (MACRODANTIN) 100 MG capsule  05/30/10  Yes Historical Provider, MD  Probiotic Product (PHILLIPS COLON HEALTH PO) Take 1 capsule by mouth daily.   Yes Historical Provider, MD  venlafaxine (EFFEXOR) 75 MG tablet Take 75 mg by mouth daily.    Yes Historical Provider, MD  naproxen (NAPROSYN) 500 MG tablet Take 500 mg by mouth daily.  05/30/10   Historical Provider, MD     Review of Systems  Positive ROS: As above  All other systems have been reviewed and were otherwise negative with the exception of those mentioned in the HPI and as above.  Objective: Vital signs in last 24 hours: Temp:  [97.8 F (36.6 C)] 97.8 F (36.6 C) (05/29  8341) Pulse Rate:  [53] 53  (05/29 0621) Resp:  [18] 18  (05/29 0621) BP: (172)/(85) 172/85 mmHg (05/29 0621) SpO2:  [99 %] 99 % (05/29 0621)  General Appearance: Alert, cooperative, no distress, appears stated age Head: Normocephalic, without obvious abnormality, atraumatic Eyes: PERRL, conjunctiva/corneas clear, EOM's intact, fundi benign, both eyes      Ears: Normal TM's and external ear canals, both ears Throat: Lips, mucosa, and tongue normal; teeth and gums normal Neck: Supple, symmetrical, trachea midline, no adenopathy; thyroid: No enlargement/tenderness/nodules; no carotid bruit or JVD Back: Symmetric, no curvature, ROM normal, no CVA tenderness the patient's incision is well-healed. Lungs: Clear to auscultation bilaterally, respirations unlabored Heart: Regular rate and rhythm, S1 and S2 normal, no murmur, rub or gallop Abdomen: Soft, non-tender, bowel sounds active all four quadrants, no masses, no organomegaly Extremities: Extremities normal, atraumatic, no cyanosis or edema Pulses: 2+ and symmetric all extremities Skin: Skin color, texture, turgor normal, no rashes or lesions  NEUROLOGIC:   Mental status: alert and oriented, no aphasia, good attention span, Fund of knowledge/ memory ok Motor Exam - grossly normal Sensory Exam - grossly normal Reflexes:  Coordination - grossly normal Gait - grossly normal Balance - grossly normal Cranial Nerves: I: smell Not tested  II: visual acuity  OS: Normal    OD: Normal   II: visual fields Full to confrontation  II: pupils Equal, round, reactive to light  III,VII: ptosis None  III,IV,VI: extraocular muscles  Full ROM  V: mastication Normal  V: facial light touch sensation  Normal  V,VII: corneal reflex  Present  VII: facial muscle function - upper  Normal  VII: facial muscle function - lower Normal  VIII: hearing Not tested  IX: soft palate elevation  Normal  IX,X: gag reflex Present  XI: trapezius strength  5/5  XI:  sternocleidomastoid strength 5/5  XI: neck flexion strength  5/5  XII: tongue strength  Normal    Data Review Lab Results  Component Value Date   WBC 5.7 10/17/2011   HGB 12.8 10/17/2011   HCT 36.9 10/17/2011   MCV 94.6 10/17/2011   PLT 175 10/17/2011   Lab Results  Component Value Date   NA 140 10/17/2011   K 3.7 10/17/2011   CL 102 10/17/2011   CO2 27 10/17/2011   BUN 15 10/17/2011   CREATININE 0.66 10/17/2011   GLUCOSE 101* 10/17/2011   No results found for this basename: INR, PROTIME    Assessment/Plan: 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 I discussed situation with the patient and her husband. I reviewed the MR scan with them and pointed out the abnormalities. We have discussed the various treatment options including surgery.  I described the surgical option of a exploration of lumbar fusion with a L2-3 decompression instrumentation and fusion. I've shown then surgical models. We have discussed the risks, benefits, alternatives and likelihood of achieving our goals with surgery. I've answered all the questions. They want to proceed with surgery.   Osie Amparo D 10/23/2011 8:34 AM

## 2011-10-23 NOTE — Anesthesia Preprocedure Evaluation (Addendum)
Anesthesia Evaluation  Patient identified by MRN, date of birth, ID band Patient awake    Reviewed: Allergy & Precautions, H&P , NPO status , Patient's Chart, lab work & pertinent test results  History of Anesthesia Complications Negative for: history of anesthetic complications  Airway Mallampati: I TM Distance: >3 FB Neck ROM: Full    Dental  (+) Dental Advisory Given   Pulmonary sleep apnea and Continuous Positive Airway Pressure Ventilation ,          Cardiovascular hypertension, Pt. on medications +CHF     Neuro/Psych Anxiety Depression  Neuromuscular disease CVA, No Residual Symptoms    GI/Hepatic GERD-  Controlled,(+) Hepatitis -  Endo/Other  Diabetes mellitus-, Well Controlled  Renal/GU negative Renal ROS  negative genitourinary   Musculoskeletal   Abdominal   Peds  Hematology negative hematology ROS (+)   Anesthesia Other Findings   Reproductive/Obstetrics negative OB ROS                          Anesthesia Physical Anesthesia Plan  ASA: III  Anesthesia Plan: General   Post-op Pain Management:    Induction: Intravenous  Airway Management Planned: Oral ETT  Additional Equipment:   Intra-op Plan:   Post-operative Plan: Extubation in OR  Informed Consent: I have reviewed the patients History and Physical, chart, labs and discussed the procedure including the risks, benefits and alternatives for the proposed anesthesia with the patient or authorized representative who has indicated his/her understanding and acceptance.     Plan Discussed with: CRNA, Surgeon and Anesthesiologist  Anesthesia Plan Comments:        Anesthesia Quick Evaluation

## 2011-10-23 NOTE — Progress Notes (Signed)
Patient ID: [name], female   DOB: Aug 18, 1945, 66 y.o.   MRN: [id number] Subjective:  The patient is alert and pleasant. She looks well. She is in no apparent distress.  Objective: Vital signs in last 24 hours: Temp:  [97.8 F (36.6 C)-98 F (36.7 C)] 98 F (36.7 C) (05/29 1243) Pulse Rate:  [53-60] 60  (05/29 1243) Resp:  [18-41] 41  (05/29 1243) BP: (172-182)/(84-85) 182/84 mmHg (05/29 1243) SpO2:  [99 %-100 %] 100 % (05/29 1243)  Intake/Output from previous day:   Intake/Output this shift: Total I/O In: 2250 [I.V.:2000; IV Piggyback:250] Out: 340 [Urine:140; Blood:200]  Physical exam the patient is moving all 4 extremities well.  Lab Results: No results found for this basename: WBC:2,HGB:2,HCT:2,PLT:2 in the last 72 hours BMET No results found for this basename: NA:2,K:2,CL:2,CO2:2,GLUCOSE:2,BUN:2,CREATININE:2,CALCIUM:2 in the last 72 hours  Studies/Results: No results found.  Assessment/Plan: The patient is doing well.  LOS: 0 days     Alyssa Mancera D 10/23/2011, 1:04 PM

## 2011-10-23 NOTE — Op Note (Signed)
Brief history: The patient is a 66 year old white female previously performed an L3-S1 fusion on. She has developed recurrent back buttocks and leg pain consistent with neurogenic claudication. She has failed medical management and was worked up with a lumbar MRI. This demonstrated patient had spinal stenosis and degenerative changes at L2-3. I discussed the various treatment options with her including surgery. The patient has weighed the risks, benefits, and alternatives surgery decided proceed with a exploration of her lumbar fusion with an L2-3 decompression instrumentation and fusion.  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 Etc. requiring a wide decompression of the nerve roots.); L2-3 posterior lumbar interbody fusion with local morselized autograft bone and Actifusebone graft extender; insertion of interbody prosthesis at L2-3 (globus peek interbody prosthesis); posterior segmental instrumentation from L2-L4 with globus titanium pedicle screws and rods; posterior lateral arthrodesis at L2-3 with local morselized autograft bone and Vitoss bone graft extender.  Surgeon: Dr. Delma Officer  Asst.: Dr. Hilda Lias  Anesthesia: Gen. endotracheal  Estimated blood loss: 200 cc  Drains: None  Locations: None  Description of procedure: The patient was brought to the operating room by the anesthesia team. General endotracheal anesthesia was induced. The patient was turned to the prone position on the Wilson frame. The patient's  lumbosacral region was then prepared with Betadine scrub and Betadine solution. Sterile drapes were applied.  I then injected the area to be incised with Marcaine with epinephrine solution. I then used the scalpel to make a linear midline incision over the L2-3 interspace incising through the patient's prior surgical scar.. I then used electrocautery to perform a bilateral subperiosteal dissection exposing the spinous process and lamina of L2 down to L4. We confirmed our location based on the prior instrumentation. We then inserted the cerebellar retractor to provide exposure.  I began the decompression by using the high speed drill to perform laminotomies at L2. We then used the Kerrison punches to widen the laminotomy and removed the ligamentum flavum at L2-3 as well as removed the cephalad aspect of the L3 lamina.. We used the Kerrison punches to remove the medial facets at L2-3. We performed wide foraminotomies about the bilateral L2 and L3 nerve roots completing the decompression.  We now turned our attention to the posterior lumbar interbody fusion. I used a scalpel to incise the intervertebral disc at L2-3 bilaterally. I then performed a partial intervertebral discectomy at L2-3 using the pituitary forceps. We prepared the vertebral endplates at L2-3 for the fusion by removing the soft tissues with the curettes. We then used the trial spacers to pick the appropriate sized interbody prosthesis. We prefilled his prosthesis with a combination of local morselized autograft bone that we obtained during the decompression as well as Actifuse bone graft extender. We inserted the prefilled prosthesis into the interspace at L2-3. There was a good snug fit of the prosthesis in the interspace. We then filled and the remainder of the intervertebral disc space with local morselized autograft bone and Actifuse. This completed the posterior lumbar interbody arthrodesis.  We now turned attention to the  instrumentation. Under fluoroscopic guidance we cannulated the bilateral L2 pedicles with the  bone probe. We then removed the bone probe. He then tapped the pedicle with a 5.5 millimeter tap. We then removed the tap. We probed inside the tapped pedicle with a ball probe to rule out cortical breaches. We then inserted a 6.5 x 50 millimeter pedicle screw into the L2 pedicles bilaterally under fluoroscopic guidance. We then palpated along the medial aspect of the pedicles to rule out cortical breaches. There were none. The nerve roots were not injured. We then connected the unilateral pedicle screws with a add on rod connecting into the old rod from L4 to S1. We compressed the construct and secured the rod in place with the caps. We then tightened the caps appropriately. This completed the instrumentation from L2-L4.  We now turned our attention to the posterior lateral arthrodesis at L2-3. We used the high-speed drill to decorticate the remainder of the facets, pars, transverse process at L2-3. We then applied a combination of local morselized autograft bone and Vitoss bone graft extender over these decorticated posterior lateral structures. This completed the posterior lateral arthrodesis.  We then obtained hemostasis using bipolar electrocautery. We irrigated the wound out with bacitracin solution. We inspected the thecal sac and nerve roots and noted they were well decompressed. We then removed the retractor. We reapproximated patient's thoracolumbar fascia with interrupted #1 Vicryl suture. We reapproximated patient's subcutaneous tissue with interrupted 2-0 Vicryl suture. The reapproximated patient's skin with Steri-Strips and benzoin. The wound was then coated with bacitracin ointment. A sterile dressing was applied. The drapes were removed. The patient was subsequently returned to the supine position where they were extubated by the anesthesia team. He was then transported to the post anesthesia care unit  in stable condition. All sponge instrument and needle counts were correct at the end of this case.

## 2011-10-23 NOTE — Anesthesia Postprocedure Evaluation (Signed)
Anesthesia Post Note  Patient: [name]  Procedure(s) Performed: Procedure(s) (LRB): POSTERIOR LUMBAR FUSION 1 WITH HARDWARE REMOVAL (N/A)  Anesthesia type: general  Patient location: PACU  Post pain: Pain level controlled  Post assessment: Patient's Cardiovascular Status Stable  Last Vitals:  Filed Vitals:   10/23/11 1313  BP: 176/94  Pulse: 56  Temp:   Resp: 12    Post vital signs: Reviewed and stable  Level of consciousness: sedated  Complications: No apparent anesthesia complications

## 2011-10-24 LAB — CBC
HCT: 30.6 % — ABNORMAL LOW (ref 36.0–46.0)
MCV: 95 fL (ref 78.0–100.0)
RBC: 3.22 MIL/uL — ABNORMAL LOW (ref 3.87–5.11)
RDW: 12.3 % (ref 11.5–15.5)
WBC: 9.4 10*3/uL (ref 4.0–10.5)

## 2011-10-24 LAB — BASIC METABOLIC PANEL
BUN: 17 mg/dL (ref 6–23)
CO2: 27 mEq/L (ref 19–32)
Chloride: 102 mEq/L (ref 96–112)
Creatinine, Ser: 0.85 mg/dL (ref 0.50–1.10)
GFR calc Af Amer: 82 mL/min — ABNORMAL LOW (ref >90)
Potassium: 4 mEq/L (ref 3.5–5.1)

## 2011-10-24 MED ORDER — CARBIDOPA-LEVODOPA 25-100 MG PO TABS
1.5000 | ORAL_TABLET | ORAL | Status: DC
  Administered 2011-10-24 – 2011-10-25 (×2): 1.5 via ORAL
  Filled 2011-10-24 (×7): qty 1.5

## 2011-10-24 MED ORDER — MORPHINE SULFATE 2 MG/ML IJ SOLN: 2.0000 mg | INTRAMUSCULAR | Status: DC | PRN

## 2011-10-24 MED ORDER — ENTACAPONE 200 MG PO TABS
200.0000 mg | ORAL_TABLET | ORAL | Status: DC
  Administered 2011-10-24 – 2011-10-25 (×2): 200 mg via ORAL
  Filled 2011-10-24 (×7): qty 1

## 2011-10-24 NOTE — Evaluation (Signed)
Physical Therapy Evaluation Patient Details Name: [name] MRN: [id number] DOB: 04/26/1946 Today's Date: 10/24/2011 Time: 4098-1191 PT Time Calculation (min): 28 min  PT Assessment / Plan / Recommendation Clinical Impression  Pt s/p PLF L2-3 with a h/o Parkinson's and CVA with no residual deficits. Pt with decreased functional mobility and strength as well as limited by pain. Pt will benefit from skilled PT in the acute care setting in order to maximize functional mobility and safety prior to d/c with husband    PT Assessment  Patient needs continued PT services    Follow Up Recommendations  Home health PT;Supervision/Assistance - 24 hour    Barriers to Discharge        lEquipment Recommendations  None recommended by PT    Recommendations for Other Services     Frequency Min 5X/week    Precautions / Restrictions Precautions Precautions: Back Precaution Booklet Issued: Yes (comment) Precaution Comments: pt educated on 3/3 back precautions Required Braces or Orthoses: Spinal Brace Spinal Brace: Lumbar corset;Applied in sitting position Restrictions Weight Bearing Restrictions: No         Mobility  Bed Mobility Bed Mobility: Rolling Right;Right Sidelying to Sit;Sitting - Scoot to [place] of Bed Rolling Right: 4: Min assist;With rail Right Sidelying to Sit: 3: Mod assist;With rails;HOB elevated Sitting - Scoot to Edge of Bed: 3: Mod assist Details for Bed Mobility Assistance: VC for proper sequencing to maintain back precautions. Pt hesitant to complete requiring assist through trunk for stability Transfers Transfers: Sit to Stand;Stand Pivot Transfers;Stand to Sit Sit to Stand: 3: Mod assist;With upper extremity assist;From bed Stand to Sit: 3: Mod assist;With upper extremity assist;To chair/3-in-1 Stand Pivot Transfers: 3: Mod assist;With armrests Details for Transfer Assistance: VC for hand placement. Mod assist for stability from bed to chair, husband  guarding. Ambulation/Gait Ambulation/Gait Assistance: Not tested (comment)    Exercises     PT Diagnosis: Generalized weakness;Acute pain;Difficulty walking  PT Problem List: Decreased strength;Decreased activity tolerance;Decreased mobility;Decreased knowledge of use of DME;Decreased safety awareness;Decreased knowledge of precautions;Pain PT Treatment Interventions: DME instruction;Gait training;Stair training;Functional mobility training;Therapeutic activities;Patient/family education   PT Goals Acute Rehab PT Goals PT Goal Formulation: With patient Time For Goal Achievement: 10/31/11 Potential to Achieve Goals: Fair Pt will Roll Supine to Right Side: with modified independence PT Goal: Rolling Supine to Right Side - Progress: Goal set today Pt will Roll Supine to Left Side: with modified independence PT Goal: Rolling Supine to Left Side - Progress: Goal set today Pt will go Supine/Side to Sit: with supervision PT Goal: Supine/Side to Sit - Progress: Goal set today Pt will go Sit to Supine/Side: with supervision PT Goal: Sit to Supine/Side - Progress: Goal set today Pt will go Sit to Stand: with supervision PT Goal: Sit to Stand - Progress: Goal set today Pt will go Stand to Sit: with supervision PT Goal: Stand to Sit - Progress: Goal set today Pt will Transfer Bed to Chair/Chair to Bed: with supervision PT Transfer Goal: Bed to Chair/Chair to Bed - Progress: Goal set today Pt will Ambulate: >150 feet;with supervision;with least restrictive assistive device PT Goal: Ambulate - Progress: Goal set today  Visit Information  Last PT Received On: 10/24/11 Assistance Needed: +1    Subjective Data      Prior Functioning  Home Living Lives With: Spouse Available Help at Discharge: Family;Available 24 hours/day Type of Home: Mobile home Home Access: Ramped entrance Home Layout: One level Bathroom Shower/Tub: Walk-in shower;Door [employer] Toilet: Administrator  Accessibility: Yes How Accessible: Accessible via walker Home Adaptive Equipment: Wheelchair - manual;Other (comment);Walker - rolling;Bedside commode/3-in-1 (rollator) Prior Function Level of Independence: Needs assistance Needs Assistance: Transfers;Dressing Dressing: Minimal Transfer Assistance: husband helps get her out of bed in the morning Able to Take Stairs?: No Driving: No Vocation: Retired Musician: No difficulties Dominant Hand: Right    Cognition  Overall Cognitive Status: History of cognitive impairments - further impaired Area of Impairment: Memory;Safety/judgement Arousal/Alertness: Awake/alert Orientation Level: Appears intact for tasks assessed Behavior During Session: [facility] for tasks performed Memory: Decreased recall of precautions Memory Deficits: limited short term memory Safety/Judgement: Decreased awareness of safety precautions Cognition - Other Comments: Husband stated that it is a combination of her Parkinson's which makes her confused at times and decreased memory retention as well as a combination of medicine    Extremity/Trunk Assessment Right Lower Extremity Assessment RLE ROM/Strength/Tone: Deficits RLE ROM/Strength/Tone Deficits: >/= 4/5 RLE Sensation: WFL - Light Touch Left Lower Extremity Assessment LLE ROM/Strength/Tone: Deficits LLE ROM/Strength/Tone Deficits: >/= 4/5 LLE Sensation: WFL - Light Touch   Balance    End of Session PT - End of Session Equipment Utilized During Treatment: Gait belt;Back brace Activity Tolerance: Patient limited by pain Patient left: in chair;with call bell/phone within reach;with family/visitor present Nurse Communication: Mobility status   Milana Kidney 10/24/2011, 1:28 PM  10/24/2011 Milana Kidney DPT PAGER: [phone] OFFICE: [phone]

## 2011-10-24 NOTE — Progress Notes (Signed)
UR COMPLETED  

## 2011-10-24 NOTE — Progress Notes (Signed)
Patient ID: [name], female   DOB: [birth year], 66 y.o.   MRN: [id number] Subjective:  The patient is alert and pleasant. Her back is sore. She looks well. She is not using her PCA much.  Objective: Vital signs in last 24 hours: Temp:  [98.1 F (36.7 C)-100.9 F (38.3 C)] 100.9 F (38.3 C) (05/30 1805) Pulse Rate:  [57-82] 63  (05/30 1805) Resp:  [12-18] 18  (05/30 1805) BP: (128-164)/(68-80) 153/75 mmHg (05/30 1805) SpO2:  [95 %-100 %] 97 % (05/30 1805)  Intake/Output from previous day: 05/29 0701 - 05/30 0700 In: 3378.8 [I.V.:3128.8; IV Piggyback:250] Out: 1760 [Urine:1560; Blood:200] Intake/Output this shift: Total I/O In: 240 [P.O.:240] Out: 550 [Urine:550]  Physical exam the patient is alert and oriented. She is moving all 4 extremities well. Her dressing is clean and dry.  Lab Results:  [facility] 10/24/11 0525  WBC 9.4  HGB 10.6*  HCT 30.6*  PLT 161   BMET  Basename 10/24/11 0525  NA 140  K 4.0  CL 102  CO2 27  GLUCOSE 119*  BUN 17  CREATININE 0.85  CALCIUM 8.8    Studies/Results: Dg Lumbar Spine 2-3 Views  10/23/2011  *RADIOLOGY REPORT*  Clinical Data: Extension of prior lumbar fusion.  DG C-ARM 1-60 MIN,LUMBAR SPINE - 2-3 VIEW  Technique: Two fluoroscopic spot views of the lumbar spine provided.  Comparison:  Plain films lumbar spine 09/26/2011.  Findings: Provided images demonstrate extension of previous L3-S1 fusion to include the L2-3 level.  Hardware appears intact.  IMPRESSION: Extension of lumbar fusion to include L2-3.  Original Report Authenticated By: Bernadene Bell. D'ALESSIO, M.D.   Dg C-arm 1-60 Min  10/23/2011  *RADIOLOGY REPORT*  Clinical Data: Extension of prior lumbar fusion.  DG C-ARM 1-60 MIN,LUMBAR SPINE - 2-3 VIEW  Technique: Two fluoroscopic spot views of the lumbar spine provided.  Comparison:  Plain films lumbar spine 09/26/2011.  Findings: Provided images demonstrate extension of previous L3-S1 fusion to include the L2-3 level.  Hardware  appears intact.  IMPRESSION: Extension of lumbar fusion to include L2-3.  Original Report Authenticated By: Bernadene Bell. Maricela Curet, M.D.    Assessment/Plan: Postop day #1: The patient is doing well. I will DC her PCA. We will continue to mobilize her with PT/OT.  LOS: 1 day     Novelle Addair D 10/24/2011, 6:16 PM

## 2011-10-24 NOTE — Clinical Social Work Note (Signed)
CSW received consult for SNF. PT/OT is recommending home health services. Discussed pt in progressing with RN. RNCM is aware and following. CSW is signing off as no further needs identified. Please reconsult if a need arises prior to discharge.   Dede Query, MSW, Theresia Majors [phone]

## 2011-10-24 NOTE — Evaluation (Signed)
Occupational Therapy Evaluation Patient Details Name: [name] MRN: [id number] DOB: 1945/12/21 Today's Date: 10/24/2011 Time: 4098-1191 OT Time Calculation (min): 25 min  OT Assessment / Plan / Recommendation Clinical Impression  This 66 y.o. female admitted for PLF L2-3 with a h/o Parkinson's.  Pt. demonstrates the below listed deficits and will benefit from OT to maximize safety and independence with BADLs to allow her to return home with spouse at min - mod A with ADLs.      OT Assessment  Patient needs continued OT Services    Follow Up Recommendations  Home health OT;Supervision/Assistance - 24 hour    Barriers to Discharge None    Equipment Recommendations  None recommended by OT    Recommendations for Other Services    Frequency  Min 2X/week    Precautions / Restrictions Precautions Precautions: Back Precaution Booklet Issued: Yes (comment) Precaution Comments: pt educated on 3/3 back precautions Required Braces or Orthoses: Spinal Brace Spinal Brace: Lumbar corset;Applied in sitting position Restrictions Weight Bearing Restrictions: No       ADL  Eating/Feeding: Performed;Independent (finger foods) Where Assessed - Eating/Feeding: Bed level Grooming: Simulated;Wash/dry hands;Wash/dry face;Teeth care;Minimal assistance Where Assessed - Grooming: Unsupported sitting Upper Body Bathing: Simulated;Minimal assistance Where Assessed - Upper Body Bathing: Unsupported sitting Lower Body Bathing: Simulated;Maximal assistance Where Assessed - Lower Body Bathing: Unsupported sit to stand Upper Body Dressing: Simulated;Minimal assistance Where Assessed - Upper Body Dressing: Unsupported sitting Lower Body Dressing: Simulated;+1 Total assistance Where Assessed - Lower Body Dressing: Unsupported sit to stand Toilet Transfer: Performed;Minimal assistance Toilet Transfer Method: Sit to Barista: Bedside commode Toileting - Clothing Manipulation and  Hygiene: Performed;Maximal assistance Where Assessed - Engineer, mining and Hygiene: Standing Equipment Used: Other (comment) (BSC) Transfers/Ambulation Related to ADLs: Min A and verbal cues for hand placement ADL Comments: Pt. instructed in back precautions and able to verbalize 2/3.  Requires min A to maintain them during activity    OT Diagnosis: Acute pain;Generalized weakness;Cognitive deficits  OT Problem List: Decreased strength;Decreased activity tolerance;Impaired balance (sitting and/or standing);Decreased coordination;Decreased safety awareness;Decreased cognition;Decreased knowledge of use of DME or AE;Decreased knowledge of precautions;Pain OT Treatment Interventions: Self-care/ADL training;DME and/or AE instruction;Therapeutic activities;Visual/perceptual remediation/compensation;Patient/family education;Balance training   OT Goals Acute Rehab OT Goals OT Goal Formulation: With patient/family Time For Goal Achievement: 10/31/11 Potential to Achieve Goals: Good ADL Goals Pt Will Perform Grooming: Standing at sink (min guard assist) ADL Goal: Grooming - Progress: Goal set today Pt Will Perform Upper Body Bathing: with supervision;Sitting, chair ADL Goal: Upper Body Bathing - Progress: Goal set today Pt Will Perform Lower Body Bathing: with min assist;Sit to stand from bed;Sit to stand from chair ADL Goal: Lower Body Bathing - Progress: Goal set today Pt Will Perform Upper Body Dressing: with supervision;Sitting, chair;Sitting, bed ADL Goal: Upper Body Dressing - Progress: Goal set today Pt Will Perform Lower Body Dressing: with mod assist;Sit to stand from chair;Sit to stand from bed;with adaptive equipment ADL Goal: Lower Body Dressing - Progress: Goal set today Pt Will Transfer to Toilet: Ambulation;Comfort height toilet (min guard assist) ADL Goal: Toilet Transfer - Progress: Goal set today Pt Will Perform Toileting - Clothing Manipulation: with  supervision;Standing ADL Goal: Toileting - Clothing Manipulation - Progress: Goal set today Pt Will Perform Toileting - Hygiene: with supervision;Standing at 3-in-1/toilet ADL Goal: Toileting - Hygiene - Progress: Goal set today Pt Will Perform Tub/Shower Transfer: with min assist;Shower seat with back;Ambulation;Shower transfer ADL Goal: Web designer - Progress: Goal  set today Additional ADL Goal #1: Pt/family will be independent with donning/doffing brace ADL Goal: Additional Goal #1 - Progress: Goal set today  Visit Information  Last OT Received On: 10/24/11 Assistance Needed: +1    Subjective Data  Subjective: She did a lot better with that (bed mobility) than she did earlier Patient Stated Goal: To get better   Prior Functioning  Home Living Lives With: Spouse Available Help at Discharge: Family;Available 24 hours/day Type of Home: Mobile home Home Access: Ramped entrance Home Layout: One level Bathroom Shower/Tub: Walk-in shower;Door [employer] Toilet: Standard Bathroom Accessibility: Yes How Accessible: Accessible via walker Home Adaptive Equipment: Wheelchair - manual;Other (comment);Walker - rolling;Bedside commode/3-in-1 Prior Function Level of Independence: Needs assistance Needs Assistance: Transfers;Dressing Dressing: Minimal Transfer Assistance: husband helps get her out of bed in the morning Able to Take Stairs?: No Driving: No Vocation: Retired Musician: No difficulties Dominant Hand: Right    Cognition  Overall Cognitive Status: History of cognitive impairments - further impaired (Pt. very slow to respond - may be due to meds) Area of Impairment: Memory;Safety/judgement Arousal/Alertness: Awake/alert Orientation Level: Appears intact for tasks assessed Behavior During Session: [facility] for tasks performed Memory: Decreased recall of precautions Memory Deficits: limited short term memory Safety/Judgement: Decreased awareness of  safety precautions Cognition - Other Comments: Husband stated that it is a combination of her Parkinson's which makes her confused at times and decreased memory retention as well as a combination of medicine    Extremity/Trunk Assessment Right Upper Extremity Assessment RUE ROM/Strength/Tone: Deficits RUE ROM/Strength/Tone Deficits: mild tremors noted Left Upper Extremity Assessment LUE ROM/Strength/Tone: Deficits LUE ROM/Strength/Tone Deficits: mild tremors noted   Mobility Bed Mobility Bed Mobility: Rolling Left;Left Sidelying to Sit;Sitting - Scoot to [place] of Bed;Sit to Sidelying Left Rolling Right: 4: Min assist;With rail Rolling Left: 4: Min assist Right Sidelying to Sit: 3: Mod assist;With rails;HOB elevated Left Sidelying to Sit: 4: Min assist;With rails;HOB flat Sitting - Scoot to Edge of Bed: 4: Min assist;With rail Sit to Sidelying Left: 4: Min assist;With rail Details for Bed Mobility Assistance: instruction for hand placement and back precautions Transfers Transfers: Sit to Stand;Stand to Sit Sit to Stand: 4: Min assist;With upper extremity assist;From bed;From chair/3-in-1 Stand to Sit: 4: Min assist;With upper extremity assist;To bed;To chair/3-in-1 Details for Transfer Assistance: VC for hand placement. Mod assist for stability from bed to chair, husband guarding.   Exercise    Balance    End of Session OT - End of Session Activity Tolerance: Patient limited by fatigue Patient left: in bed;with call bell/phone within reach;with family/visitor present   Claud Gowan, Ursula Alert M 10/24/2011, 1:45 PM

## 2011-10-24 NOTE — Care Management Note (Signed)
    Page 1 of 1   10/25/2011     11:16:48 AM   CARE MANAGEMENT NOTE 10/25/2011  Patient:  [name], [name]   Account Number:  [id number]  Date Initiated:  10/24/2011  Documentation initiated by:  [facility]  Subjective/Objective Assessment:   Admitted postop L2-3 laminectomy,fusion. Lives with spouse, hx of Parkinsons.     Action/Plan:   PT eval- recommending HHPT  OT eva- recommending HHOT   Anticipated DC Date:  10/27/2011   Anticipated DC Plan:  HOME W HOME HEALTH SERVICES      DC Planning Services  CM consult      Choice offered to / List presented to:  C-1 Patient        HH arranged  HH-2 PT  HH-3 OT      [facility] agency  [facility]   Status of service:  Completed, signed off Medicare Important Message given?   (If response is "NO", the following Medicare IM given date fields will be blank) Date Medicare IM given:   Date Additional Medicare IM given:    Discharge Disposition:  HOME W HOME HEALTH SERVICES  Per UR Regulation:  Reviewed for med. necessity/level of care/duration of stay  If discussed at Long Length of Stay Meetings, dates discussed:    Comments:  10/24/11 PT and OT recommending HHPT and HHOT. Spoke with patient and her daughter about HHC. They selected [facility] from the [facility] list of [facility] agencies. Contacted Tashica Provencio at [facility] and requested HHPT and HHOT, orders pending.Will continue to follow for d/c needs. Jacquelynn Cree RN, BSN, CCM

## 2011-10-25 MED ORDER — OXYCODONE-ACETAMINOPHEN 5-325 MG PO TABS: 1.0000 | ORAL_TABLET | ORAL | Status: AC | PRN

## 2011-10-25 MED ORDER — DSS 100 MG PO CAPS: 100.0000 mg | ORAL_CAPSULE | Freq: Two times a day (BID) | ORAL | Status: AC

## 2011-10-25 MED FILL — Sodium Chloride Irrigation Soln 0.9%: Qty: 3000 | Status: AC

## 2011-10-25 MED FILL — Heparin Sodium (Porcine) Inj 1000 Unit/ML: INTRAMUSCULAR | Qty: 30 | Status: AC

## 2011-10-25 MED FILL — Sodium Chloride IV Soln 0.9%: INTRAVENOUS | Qty: 1000 | Status: AC

## 2011-10-25 NOTE — Progress Notes (Signed)
Patient is discharged today, assessments remained stable temp on discharge is 100.6, tyelenol given and patient was advice to stay at least an hour to see how the temperature will do, but she said she is in a hurry to leave.paper work and prescriptions given and dully sign. She is accompanied by husband and wheeled down by volunteer.

## 2011-10-25 NOTE — Plan of Care (Signed)
Problem: Consults Goal: Diagnosis - Spinal Surgery Outcome: Completed/Met Date Met:  10/25/11 [place]/Lumbar Spine Fusion

## 2011-10-25 NOTE — Progress Notes (Signed)
Physical Therapy Treatment Patient Details Name: [name] MRN: [id number] DOB: Apr 10, 1946 Today's Date: 10/25/2011 Time: 4098-1191 PT Time Calculation (min): 14 min  PT Assessment / Plan / Recommendation Comments on Treatment Session  Pt progressing slowly, able to ambulate today with minguard-min assist. Pt is planning to d/c home today with min assist from husband. Husband aware of back precautions as pt with decreased memory retention. HHPT will follow for safety    Follow Up Recommendations  Home health PT;Supervision/Assistance - 24 hour    Barriers to Discharge        Equipment Recommendations  None recommended by OT;None recommended by PT    Recommendations for Other Services    Frequency Min 5X/week   Plan Discharge plan remains appropriate;Frequency remains appropriate    Precautions / Restrictions Precautions Precautions: Back Precaution Booklet Issued: Yes (comment) Precaution Comments: husband able to verbalize back precautions, pt still requiring cueing to maintain Required Braces or Orthoses: Spinal Brace Spinal Brace: Lumbar corset;Applied in sitting position Restrictions Weight Bearing Restrictions: No       Mobility  Bed Mobility Rolling Right: 5: Supervision;With rail Right Sidelying to Sit: 4: Min assist Sitting - Scoot to Edge of Bed: 4: Min assist;With rail Details for Bed Mobility Assistance: VC for safe back precautions during all bed mobility. Min assist through trunk into sitting Transfers Transfers: Sit to Stand;Stand to Sit Sit to Stand: 5: Supervision;With upper extremity assist;From bed Stand to Sit: 5: Supervision;With upper extremity assist;To chair/3-in-1 Details for Transfer Assistance: VC for hand placement Ambulation/Gait Ambulation/Gait Assistance: 4: Min guard Ambulation Distance (Feet): 150 Feet Assistive device: Rolling walker Ambulation/Gait Assistance Details: VC for proper sequencing and safety with distance to RW. Increased  assist at times due to minimal balance loss Gait Pattern: Step-to pattern;Decreased hip/knee flexion - right;Decreased hip/knee flexion - left;Decreased stride length;Trunk flexed;Antalgic Gait velocity: decreased gait speed Stairs: No    Exercises     PT Diagnosis:    PT Problem List:   PT Treatment Interventions:     PT Goals Acute Rehab PT Goals PT Goal Formulation: With patient PT Goal: Rolling Supine to Right Side - Progress: Progressing toward goal PT Goal: Rolling Supine to Left Side - Progress: Progressing toward goal PT Goal: Supine/Side to Sit - Progress: Progressing toward goal PT Goal: Sit to Supine/Side - Progress: Progressing toward goal PT Goal: Sit to Stand - Progress: Progressing toward goal PT Goal: Stand to Sit - Progress: Progressing toward goal PT Transfer Goal: Bed to Chair/Chair to Bed - Progress: Progressing toward goal PT Goal: Ambulate - Progress: Progressing toward goal  Visit Information  Last PT Received On: 10/25/11 Assistance Needed: +1    Subjective Data      Cognition  Overall Cognitive Status: History of cognitive impairments - further impaired Area of Impairment: Memory;Safety/judgement Arousal/Alertness: Awake/alert Orientation Level: Appears intact for tasks assessed Behavior During Session: [facility] for tasks performed Memory: Decreased recall of precautions Safety/Judgement: Decreased awareness of safety precautions Cognition - Other Comments: Husband stated that it is a combination of her Parkinson's which makes her confused at times and decreased memory retention as well as a combination of medicine    Balance     End of Session PT - End of Session Equipment Utilized During Treatment: Gait belt;Back brace Activity Tolerance: Patient limited by pain Patient left: in chair;with call bell/phone within reach;with family/visitor present Nurse Communication: Mobility status    Milana Kidney 10/25/2011, 10:21 AM  10/25/2011 Milana Kidney DPT  PAGER: [phone] OFFICE: [phone]

## 2011-10-25 NOTE — Discharge Summary (Signed)
Physician Discharge Summary  Patient ID: [name] MRN: [id number] DOB/AGE: [birth year] 66 y.o.  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  Discharge Diagnoses: The same Principal Problem:  *Lumbar stenosis with neurogenic claudication   Discharged Condition: good  Hospital Course: I admitted the patient to [facility] [place] 5/29 13. On that day I performed an exploration of her lumbar fusion with an L2-3 decompression instrumentation and fusion. The surgery went well.  The patient's postop course was unremarkable and by postop day #2 she was doing well and requesting discharge to home. I gave the patient and her husband oral and written discharge instructions. I've answered all her questions.  Consults: None Significant Diagnostic Studies: None Treatments: Exploration of lumbar fusion, L2-3 compression, agitation, and fusion. Discharge Exam: Blood pressure 146/61, pulse 58, temperature 98.7 F (37.1 C), temperature source Oral, resp. rate 18, height 5' (1.524 m), weight 46.267 kg (102 lb), SpO2 98.00%. The patient is alert and oriented. Her dressing is clean and dry. Her lower extremity strength is normal. Disposition: Home  Discharge Orders    Future Orders Please Complete By Expires   Diet - low sodium heart healthy      Increase activity slowly      Discharge instructions      Comments:   Call [phone] for a followup appointment.   Remove dressing in 24 hours      Call MD for:  temperature >100.4      Call MD for:  persistant nausea and vomiting      Call MD for:  severe uncontrolled pain      Call MD for:  redness, tenderness, or signs of infection (pain, swelling, redness, odor or green/yellow discharge around incision site)      Call MD for:  difficulty breathing, headache or visual disturbances      Call MD for:  hives      Call MD for:  persistant dizziness or light-headedness      Call MD for:  extreme fatigue        Medication List  As of 10/25/2011  8:17 AM   STOP taking these medications         HYDROcodone-acetaminophen 5-325 MG per tablet      naproxen 500 MG tablet         TAKE these medications         ALPRAZolam 1 MG tablet   Commonly known as: XANAX   Take 1 mg by mouth as needed.      amLODipine 10 MG tablet   Commonly known as: NORVASC   Take 10 mg by mouth daily.      atenolol 100 MG tablet   Commonly known as: TENORMIN   Take 100 mg by mouth daily.      carbidopa-levodopa-entacapone 37.5-150-200 MG per tablet   Commonly known as: STALEVO   Take 1 tablet by mouth 4 (four) times daily. Take at 0800, 1200, 1600, 2000      cloNIDine 0.3 MG tablet   Commonly known as: CATAPRES   Take 0.3 mg by mouth daily.      DSS 100 MG Caps   Take 100 mg by mouth 2 (two) times daily.      EFFEXOR 75 MG tablet   Generic drug: venlafaxine   Take 75 mg by mouth daily.      furosemide 20 MG tablet   Commonly known as: LASIX  Take 20 mg by mouth daily.      ipratropium 0.06 % nasal spray   Commonly known as: ATROVENT      LIDODERM 5 %   Generic drug: lidocaine   Place 1 patch onto the skin daily.      losartan 100 MG tablet   Commonly known as: COZAAR   Take 100 mg by mouth daily.      nitrofurantoin 100 MG capsule   Commonly known as: MACRODANTIN      oxyCODONE-acetaminophen 5-325 MG per tablet   Commonly known as: PERCOCET   Take 1-2 tablets by mouth every 4 (four) hours as needed.      PHILL[facility] PO   Take 1 capsule by mouth daily.             SignedCristi Loron 10/25/2011, 8:17 AM

## 2012-05-01 ENCOUNTER — Other Ambulatory Visit: Payer: Self-pay | Admitting: Neurology

## 2012-05-01 ENCOUNTER — Ambulatory Visit
Admission: RE | Admit: 2012-05-01 | Discharge: 2012-05-01 | Disposition: A | Discharge status: Home / Self Care | Payer: BC Managed Care – PPO | Source: Ambulatory Visit | Attending: Neurology | Admitting: Neurology

## 2012-05-01 DIAGNOSIS — M25552 Pain in left hip: Secondary | ICD-10-CM

## 2012-07-22 DIAGNOSIS — R269 Unspecified abnormalities of gait and mobility: Secondary | ICD-10-CM | POA: Insufficient documentation

## 2012-07-22 DIAGNOSIS — G20 Parkinson's disease: Secondary | ICD-10-CM | POA: Insufficient documentation

## 2012-07-22 DIAGNOSIS — M545 Low back pain: Secondary | ICD-10-CM | POA: Insufficient documentation

## 2012-07-22 DIAGNOSIS — R413 Other amnesia: Secondary | ICD-10-CM | POA: Insufficient documentation

## 2012-07-22 DIAGNOSIS — M47817 Spondylosis without myelopathy or radiculopathy, lumbosacral region: Secondary | ICD-10-CM | POA: Insufficient documentation

## 2012-09-14 ENCOUNTER — Other Ambulatory Visit: Payer: Self-pay | Admitting: Neurology

## 2012-09-21 ENCOUNTER — Telehealth: Payer: Self-pay

## 2012-09-21 NOTE — Telephone Encounter (Signed)
Patient called asking that we contact insurance to request exception on her Stalevo.  I have faxed ins all necessary paperwork they require.  Pending response.

## 2012-09-28 ENCOUNTER — Telehealth: Payer: Self-pay | Admitting: *Deleted

## 2012-09-28 ENCOUNTER — Other Ambulatory Visit: Payer: Self-pay

## 2012-09-28 MED ORDER — OXYCODONE HCL 5 MG PO CAPS: 5.0000 mg | ORAL_CAPSULE | Freq: Four times a day (QID) | ORAL | Status: DC | PRN

## 2012-09-28 NOTE — Telephone Encounter (Signed)
Patient calling needing her oxycodone because she has severe pain in stomach and legs at night and can not sleep. She is a patient of Dr. Anne Hahn.

## 2012-09-28 NOTE — Telephone Encounter (Signed)
Patient called and left a message saying she needs to get med refill ASAP.  Says she has been having stomach pain and has to take 2 pills at night just to go to sleep.

## 2012-09-28 NOTE — Telephone Encounter (Signed)
This is a duplicate message.  See previous phone message.

## 2012-11-11 ENCOUNTER — Telehealth: Payer: Self-pay | Admitting: *Deleted

## 2012-11-11 MED ORDER — OXYCODONE HCL 5 MG PO CAPS: 5.0000 mg | ORAL_CAPSULE | Freq: Four times a day (QID) | ORAL | Status: DC | PRN

## 2012-11-11 NOTE — Telephone Encounter (Signed)
I returned call.  Pt started out talking but handed over to spouse.  She is taking the amantidine 100mg  po bid and states is working some.   The stalevo qid.  (125-150-125-125) this is the dosing he told me ??   Continues with dyskinesias worsening as day progresses.  At 1600 due to increased movement starts with back pain.  Needs refill  On oxycodone.  Also relayed that still has GI stomache pain , been to GI MD x 4, no answers.

## 2012-11-11 NOTE — Telephone Encounter (Signed)
I called the patient. She has been taking the Stalevo 150s, and they are too strong for her causing too many dyskinesias. The patient will hallucinate more on the medication. After taking her medication, the patient has some back pain it may represent a dystonia. The patient will be cut back to the 125 Stalevo tablets. The patient wants a prescription for oxycodone. The patient will be following up in office in July.

## 2012-11-11 NOTE — Telephone Encounter (Signed)
Message copied by Hermenia Fiscal on Wed Nov 11, 2012  3:26 PM ------      Message from: Anice Paganini      Created: Tue Nov 10, 2012 12:12 PM      Contact: Pt Loyalty       Pt is wanting Dr. Anne Hahn or the nurse to call her back about some medication she is taking and also some pain medication she is needing.  ------

## 2012-11-18 ENCOUNTER — Encounter (HOSPITAL_COMMUNITY): Payer: Self-pay | Admitting: Emergency Medicine

## 2012-11-18 DIAGNOSIS — G20 Parkinson's disease: Secondary | ICD-10-CM | POA: Insufficient documentation

## 2012-11-18 DIAGNOSIS — K219 Gastro-esophageal reflux disease without esophagitis: Secondary | ICD-10-CM | POA: Insufficient documentation

## 2012-11-18 DIAGNOSIS — S40019A Contusion of unspecified shoulder, initial encounter: Secondary | ICD-10-CM | POA: Insufficient documentation

## 2012-11-18 DIAGNOSIS — R269 Unspecified abnormalities of gait and mobility: Secondary | ICD-10-CM | POA: Insufficient documentation

## 2012-11-18 DIAGNOSIS — Z8673 Personal history of transient ischemic attack (TIA), and cerebral infarction without residual deficits: Secondary | ICD-10-CM | POA: Insufficient documentation

## 2012-11-18 DIAGNOSIS — Y9389 Activity, other specified: Secondary | ICD-10-CM | POA: Insufficient documentation

## 2012-11-18 DIAGNOSIS — F3289 Other specified depressive episodes: Secondary | ICD-10-CM | POA: Insufficient documentation

## 2012-11-18 DIAGNOSIS — G4730 Sleep apnea, unspecified: Secondary | ICD-10-CM | POA: Insufficient documentation

## 2012-11-18 DIAGNOSIS — Z79899 Other long term (current) drug therapy: Secondary | ICD-10-CM | POA: Insufficient documentation

## 2012-11-18 DIAGNOSIS — F411 Generalized anxiety disorder: Secondary | ICD-10-CM | POA: Insufficient documentation

## 2012-11-18 DIAGNOSIS — I10 Essential (primary) hypertension: Secondary | ICD-10-CM | POA: Insufficient documentation

## 2012-11-18 DIAGNOSIS — G20A1 Parkinson's disease without dyskinesia, without mention of fluctuations: Secondary | ICD-10-CM | POA: Insufficient documentation

## 2012-11-18 DIAGNOSIS — W010XXA Fall on same level from slipping, tripping and stumbling without subsequent striking against object, initial encounter: Secondary | ICD-10-CM | POA: Insufficient documentation

## 2012-11-18 DIAGNOSIS — Y92009 Unspecified place in unspecified non-institutional (private) residence as the place of occurrence of the external cause: Secondary | ICD-10-CM | POA: Insufficient documentation

## 2012-11-18 DIAGNOSIS — Z8739 Personal history of other diseases of the musculoskeletal system and connective tissue: Secondary | ICD-10-CM | POA: Insufficient documentation

## 2012-11-18 DIAGNOSIS — F329 Major depressive disorder, single episode, unspecified: Secondary | ICD-10-CM | POA: Insufficient documentation

## 2012-11-18 DIAGNOSIS — J45909 Unspecified asthma, uncomplicated: Secondary | ICD-10-CM | POA: Insufficient documentation

## 2012-11-18 DIAGNOSIS — Z8719 Personal history of other diseases of the digestive system: Secondary | ICD-10-CM | POA: Insufficient documentation

## 2012-11-18 DIAGNOSIS — Z87440 Personal history of urinary (tract) infections: Secondary | ICD-10-CM | POA: Insufficient documentation

## 2012-11-18 DIAGNOSIS — Z87891 Personal history of nicotine dependence: Secondary | ICD-10-CM | POA: Insufficient documentation

## 2012-11-18 NOTE — ED Notes (Signed)
PT. LOST HER BALANCED ( PARKINSON'S DISEASE) AND FELL AT HOME THIS EVENING , NO LOC , REPORTS PAIN AT LOWER BACK , BOTH ARMS AND RIGHT SHOULDER . ALERT AND ORIENTED ,RESPIRATIONS UNLABORED , HARD OF HEARING .

## 2012-11-19 ENCOUNTER — Emergency Department (HOSPITAL_COMMUNITY): Payer: Medicare Other

## 2012-11-19 ENCOUNTER — Emergency Department (HOSPITAL_COMMUNITY)
Admission: EM | Admit: 2012-11-19 | Discharge: 2012-11-19 | Disposition: A | Discharge status: Home / Self Care | Payer: Medicare Other | Attending: Emergency Medicine | Admitting: Emergency Medicine

## 2012-11-19 DIAGNOSIS — R2681 Unsteadiness on feet: Secondary | ICD-10-CM

## 2012-11-19 DIAGNOSIS — I10 Essential (primary) hypertension: Secondary | ICD-10-CM

## 2012-11-19 DIAGNOSIS — G20 Parkinson's disease: Secondary | ICD-10-CM

## 2012-11-19 DIAGNOSIS — S40011A Contusion of right shoulder, initial encounter: Secondary | ICD-10-CM

## 2012-11-19 NOTE — ED Notes (Signed)
Pt family upset due to not being discharged hours ago. Pt family states patient is ready to go home. Dr. Lavella Lemons notified and has not seen patient.

## 2012-11-19 NOTE — ED Notes (Signed)
Family at bedside. 

## 2012-11-19 NOTE — ED Provider Notes (Signed)
History    CSN: [id number] Arrival date & time 11/18/12  2327  First MD Initiated Contact with Patient 11/19/12 [phone]     Chief Complaint  Patient presents with  . Fall   (Consider location/radiation/quality/duration/timing/severity/associated sxs/prior Treatment) HPI [name] is a very pleasant elderly woman with Parkinson's disease who is brought to the emergency department by her husband after a fall. Apparently, the patient has a long history of visual hallucinations which are thought to be induced by Stalevo, her Parkinson's medication.  Prior to arrival, the patient was in her hallway. She says that she saw a woman coming toward her. This scared her so much that she angulated quickly to leave the house. She says that she slipped when she grabbed the door knob. She fell and landed against her right side. She reports lower back pain but says that this is chronic in nature. She also has pain in both of her arms, particularly her right shoulder region. She denies head trauma and loss of consciousness.  She is here with her husband he says that her mental status has been at its baseline since the injury occurred. The patient is pain-free at this time Past Medical History  Diagnosis Date  . Spondylolisthesis   . Lumbar stenosis   . Depression   . Parkinson disease   . Asthma   . Hemangioma   . Hypertension   . CVA (cerebrovascular accident)     pts lawyer stold her she had a stroke  . Sleep apnea     does not use a cpap machine  . Blood transfusion 1976  . Chronic UTI   . Hepatitis 1973    hepatitis A ?  Marland Kitchen GERD (gastroesophageal reflux disease)   . Anxiety   . Lumbar radiculopathy     parkinsons   Past Surgical History  Procedure Laterality Date  . Laminectomy    . Cardiac catheterization  2003     Angiographically patent coronary arteries with luminal irregularities in mid left anterior descending coronary artery and mid right coronary  artery. Normal left ventricular  systolic function, ejection fraction estimate of  60 to 65% Normal abdominal aorta.   Normal renal arteries.  . Abdominal hysterectomy    . Foot surgery    . Anal fissure surgery    . Appendectomy    . Back surgery    . Lumbar fusion  10/23/2011   Family History  Problem Relation Age of Onset  . Coronary artery disease    . Anesthesia problems Neg Hx    History  Substance Use Topics  . Smoking status: Former Games developer  . Smokeless tobacco: Never Used  . Alcohol Use: No   OB History   Grav Para Term Preterm Abortions TAB SAB Ect Mult Living                 Review of Systems Gen: no weight loss, fevers, chills, night sweats Eyes: no discharge or drainage, no occular pain or visual changes Nose: no epistaxis or rhinorrhea Mouth: no dental pain, no sore throat Neck: no neck pain Lungs: no SOB, cough, wheezing CV: no chest pain, palpitations, dependent edema or orthopnea Abd: no abdominal pain, nausea, vomiting GU: no dysuria or gross hematuria MSK: no myalgias or arthralgias Neuro: Parkinson's disease Skin: no rash Psyche: History of visual hallucinations.  Allergies  Estrogens; Tramadol; Hydromorphone; and Neurontin  Home Medications   Current Outpatient Rx  Name  Route  Sig  Dispense  Refill  .  ALPRAZolam (XANAX) 1 MG tablet   Oral   Take 0.5-1 mg by mouth 3 (three) times daily as needed.          Marland Kitchen amLODipine (NORVASC) 10 MG tablet   Oral   Take 10 mg by mouth daily.           Marland Kitchen atenolol (TENORMIN) 100 MG tablet   Oral   Take 100 mg by mouth daily.           . carbidopa-levodopa-entacapone (STALEVO) 31.25-125-200 MG per tablet   Oral   Take 1 tablet by mouth 4 (four) times daily. Take at 0800, 1200, 1600, and 2000         . cloNIDine (CATAPRES) 0.3 MG tablet   Oral   Take 0.3 mg by mouth daily.          . furosemide (LASIX) 20 MG tablet   Oral   Take 20 mg by mouth daily.           Marland Kitchen losartan (COZAAR) 100 MG tablet   Oral   Take 100 mg by  mouth daily.           . nitrofurantoin (MACRODANTIN) 100 MG capsule   Oral   Take 100 mg by mouth at bedtime.          Marland Kitchen oxycodone (OXY-IR) 5 MG capsule   Oral   Take 1 capsule (5 mg total) by mouth every 6 (six) hours as needed.   60 capsule   0   . venlafaxine (EFFEXOR) 75 MG tablet   Oral   Take 75 mg by mouth daily.           BP 196/90  Pulse 57  Temp(Src) 98.2 F (36.8 C) (Oral)  Resp 20  SpO2 99% Physical Exam Gen: well developed and well nourished appearing, sitting in chair, does not appear to be in distress Head: NCAT Eyes: PERL, EOMI Nose: no epistaixis or rhinorrhea Mouth/throat: mucosa is moist and pink Neck: supple, no stridor, no c spine ttp Lungs: CTA B, no wheezing, rhonchi or rales Chest: no chest wall or clavicular ttp CV: RRR, no murmur Abd: soft, notender, nondistended Back: no ttp, no cva ttp Skin: no rashese, wnl Neuro: CN ii-xii grossly intact x patient is HOH, symmetric and good strength in both arms and legs,  Psyche; flat affect,  calm and cooperative.   ED Course  Procedures (including critical care time) Labs Reviewed - No data to display Dg Lumbar Spine Complete  11/19/2012   *RADIOLOGY REPORT*  Clinical Data: Fall.  LUMBAR SPINE - COMPLETE 4+ VIEW  Comparison: 06/30/2012  Findings: Pedicle screw and interbody fusion L2-S1.  Hardware in satisfactory position and unchanged from prior radiographs.  Negative for fracture.  Normal alignment.  IMPRESSION: Negative for fracture.   Original Report Authenticated By: Janeece Riggers, M.D.   Dg Shoulder Right  11/19/2012   *RADIOLOGY REPORT*  Clinical Data: Fall.  Shoulder pain.  RIGHT SHOULDER - 2+ VIEW  Comparison: 10/27/2003  Findings: Mild degenerative changes in the right shoulder.  No acute fracture, subluxation or dislocation.  Mild osteopenia.  IMPRESSION: Degenerative changes.  No acute findings.   Original Report Authenticated By: Charlett Nose, M.D.   No diagnosis found.  MDM  Patient  is status post mechanical fall. I have not identified any acute injuries other than contusion to the right shoulder. The patient is noted to be hypertensive in the emergency department. I have discussed this with the patient and  her husband. She will take her usual doses of Norvasc and Tenormin and Cozaar and Catapres and Lasix as soon as she gets home.   I discussed x-ray findings with the patient and her husband. They are comfortable with plan to discharge home. The patient has a previous prescription for oxycodone 5 mg when necessary every 6 hours pain. I've suggested that she followup with her neurologist to discuss the problem of visual hallucinations associated with Stalevo.  Brandt Loosen, MD 11/19/12 [phone]

## 2012-11-19 NOTE — ED Notes (Signed)
MD at bedside. Dr Manly 

## 2012-11-24 ENCOUNTER — Telehealth: Payer: Self-pay | Admitting: Neurology

## 2012-11-24 MED ORDER — ARIPIPRAZOLE 2 MG PO TABS: 2.0000 mg | ORAL_TABLET | Freq: Every day | ORAL | Status: DC

## 2012-11-24 MED ORDER — RISPERIDONE 1 MG PO TABS: 1.0000 mg | ORAL_TABLET | Freq: Every day | ORAL | Status: DC

## 2012-11-24 NOTE — Telephone Encounter (Signed)
I called the patient. I spoke with her husband. The Abilify is too expensive. I will call in risperidone. We need to watch for worsening of her Parkinson's symptoms. The Seroquel in the past caused too much drowsiness.

## 2012-11-24 NOTE — Telephone Encounter (Signed)
Spouse says patients is having some serious hallucinations. Patient fell on last Wednesday running away from an hallucination, had to go to ER. Spouse says neither one of them are sleeping well because she is rambling in things, trying to hide from people, and gets upset with him because he will not just "get them out". Requesting MD advice.

## 2012-11-24 NOTE — Telephone Encounter (Signed)
I called the patient and talked with the husband. The patient is having ongoing daily hallucinations. I will call in low dose Abilify, as the patient did not do well on Seroquel.

## 2012-11-30 ENCOUNTER — Telehealth: Payer: Self-pay | Admitting: Neurology

## 2012-11-30 NOTE — Telephone Encounter (Signed)
Message copied by Elisha Headland on Mon Nov 30, 2012  2:12 PM ------      Message from: Arther Abbott B      Created: Wed Nov 25, 2012  3:19 PM      Contact: Pt [name]       Pt called to state her husband gave her pills incorrectly and she needs Dr. Anne Hahn to call her back concerning this matter.  ------

## 2012-11-30 NOTE — Telephone Encounter (Signed)
I spoke to patient and she said everything is fine and she is getting her pills correctly.

## 2012-12-14 ENCOUNTER — Encounter: Payer: Self-pay | Admitting: Neurology

## 2012-12-14 DIAGNOSIS — M545 Low back pain: Secondary | ICD-10-CM

## 2012-12-14 DIAGNOSIS — M47817 Spondylosis without myelopathy or radiculopathy, lumbosacral region: Secondary | ICD-10-CM

## 2012-12-14 DIAGNOSIS — R413 Other amnesia: Secondary | ICD-10-CM

## 2012-12-14 DIAGNOSIS — G20 Parkinson's disease: Secondary | ICD-10-CM

## 2012-12-14 DIAGNOSIS — R269 Unspecified abnormalities of gait and mobility: Secondary | ICD-10-CM

## 2012-12-15 ENCOUNTER — Encounter: Payer: Self-pay | Admitting: Neurology

## 2012-12-15 ENCOUNTER — Ambulatory Visit (INDEPENDENT_AMBULATORY_CARE_PROVIDER_SITE_OTHER): Payer: Medicare Other | Admitting: Neurology

## 2012-12-15 VITALS — BP 135/87 | HR 63 | Ht 61.0 in | Wt 100.0 lb

## 2012-12-15 DIAGNOSIS — R269 Unspecified abnormalities of gait and mobility: Secondary | ICD-10-CM

## 2012-12-15 DIAGNOSIS — R413 Other amnesia: Secondary | ICD-10-CM

## 2012-12-15 DIAGNOSIS — G20A1 Parkinson's disease without dyskinesia, without mention of fluctuations: Secondary | ICD-10-CM

## 2012-12-15 DIAGNOSIS — G20 Parkinson's disease: Secondary | ICD-10-CM

## 2012-12-15 MED ORDER — AMANTADINE HCL 100 MG PO CAPS: 100.0000 mg | ORAL_CAPSULE | Freq: Two times a day (BID) | ORAL | Status: DC

## 2012-12-15 NOTE — Progress Notes (Signed)
Reason for visit: Parkinson's disease  [name] is an 67 y.o. female  History of present illness:  [name] is a 67 year old right-handed white female with a history of Parkinson's disease and a memory disturbance. The patient has had some problems with hallucinations at night. The patient indicated that the Seroquel does not work, and the Abilify was too expensive. The patient was placed on Risperdal, but this resulted in problems with the Parkinson's disease the next day. The patient has too many dyskinesias if she takes 4 of the Stalevo daily, and she does better on 3 a day. The patient is not taking the amantadine. The patient has gone off of the donepezil. The patient returns to this office for further evaluation.  Past Medical History  Diagnosis Date  . Spondylolisthesis   . Lumbar stenosis   . Depression   . Parkinson disease   . Asthma   . Hemangioma     left posterior  . Hypertension   . CVA (cerebrovascular accident)     pts lawyer stold her she had a stroke  . Sleep apnea     does not use a cpap machine  . Blood transfusion 1976  . Chronic UTI   . Hepatitis 1973    hepatitis A ?  Marland Kitchen GERD (gastroesophageal reflux disease)   . Anxiety   . Lumbar radiculopathy     parkinsons  . Memory disorder   . Gait disorder   . Obstructive sleep apnea   . History of seizures   . Gout   . Organic heart disease, NYHA class 1   . History of frequent urinary tract infections   . Chronic back pain   . Hearing deficit     Past Surgical History  Procedure Laterality Date  . Laminectomy    . Cardiac catheterization  2003     Angiographically patent coronary arteries with luminal irregularities in mid left anterior descending coronary artery and mid right coronary  artery. Normal left ventricular systolic function, ejection fraction estimate of  60 to 65% Normal abdominal aorta.   Normal renal arteries.  . Abdominal hysterectomy    . Foot surgery    . Anal fissure surgery     . Appendectomy    . Back surgery    . Lumbar fusion  10/23/2011    Family History  Problem Relation Age of Onset  . Coronary artery disease    . Anesthesia problems Neg Hx   . Cancer Mother   . Kidney cancer Mother   . Heart disease Mother   . Depression Mother   . Heart disease Father   . Brain cancer Sister   . Depression Sister   . Diabetes Brother   . Diabetes Sister   . Depression Other   . Depression Son     Social history:  reports that she has quit smoking. She has never used smokeless tobacco. She reports that she does not drink alcohol or use illicit drugs.  Allergies:  Allergies  Allergen Reactions  . Estrogens Hives and Swelling  . Tramadol     Unsure of reaction per patient  . Hydromorphone Rash  . Neurontin (Gabapentin) Other (See Comments)    Urinary problems    Medications:  Current Outpatient Prescriptions on File Prior to Visit  Medication Sig Dispense Refill  . ALPRAZolam (XANAX) 1 MG tablet Take 0.5-1 mg by mouth 3 (three) times daily as needed.       Marland Kitchen atenolol (TENORMIN) 100  MG tablet Take 100 mg by mouth daily.        . carbidopa-levodopa-entacapone (STALEVO) 31.25-125-200 MG per tablet Take 1 tablet by mouth 3 (three) times daily. Take at 0800, 1200, 1600, and 2000      . furosemide (LASIX) 20 MG tablet Take 20 mg by mouth daily.        Marland Kitchen losartan (COZAAR) 100 MG tablet Take 100 mg by mouth 2 (two) times daily.       . nitrofurantoin (MACRODANTIN) 100 MG capsule Take 100 mg by mouth at bedtime.       Marland Kitchen oxycodone (OXY-IR) 5 MG capsule Take 1 capsule (5 mg total) by mouth every 6 (six) hours as needed.  60 capsule  0  . venlafaxine (EFFEXOR) 75 MG tablet Take 75 mg by mouth daily.       . [DISCONTINUED] gabapentin (NEURONTIN) 800 MG tablet Take 800 mg by mouth 3 (three) times daily.        . [DISCONTINUED] Potassium (POTASSIMIN PO) Take 1 tablet by mouth daily.        No current facility-administered medications on file prior to visit.     ROS:  Out of a complete 14 system review of symptoms, the patient complains only of the following symptoms, and all other reviewed systems are negative.  Fevers, chills, weight loss, fatigue Palpitations, heart murmur Hearing loss, reading in the ears, dizziness, difficulty swallowing Shortness of breath, wheezing, snoring Incontinence of bowel, diarrhea, constipation Easy bruising Feeling hot, cold, increased thirst, flushing Joint pain, joint swelling, muscle cramps, achy muscles Headache, numbness, weakness, and tremor Racing thoughts Restless legs  Blood pressure 135/87, pulse 63, height 5\' 1"  (1.549 m), weight 100 lb (45.36 kg).  Physical Exam  General: The patient is alert and cooperative at the time of the examination.  Skin: No significant peripheral edema is noted.   Neurologic Exam  Cranial nerves: Facial symmetry is present. Speech is normal, no aphasia or dysarthria is noted. Extraocular movements are full. Visual fields are full.  Motor: The patient has good strength in all 4 extremities.  Coordination: The patient has good finger-nose-finger and heel-to-shin bilaterally. The patient has significant dyskinesias of the head and neck, and legs.  Gait and station: The patient is able to walk independently, dyskinesias are noted with walking. The patient is able to arise from a seated position with the arms crossed. Tandem gait is minimally unsteady. Romberg is negative. No drift is seen.  Reflexes: Deep tendon reflexes are symmetric.   Assessment/Plan:  1. Parkinson's disease  2. Gait disturbance  3. Memory disturbance  The patient will have the Stalevo reduced to taking 1 tablet 3 times daily of the 125 mg tablets. The patient will be placed back on amantadine taking 100 mg twice daily. The patient is to stop the Risperdal and Aricept. The patient will followup through this office in 4 or 5 months.  Marlan Palau MD 12/15/2012 7:16 PM  [facility] [address] [place], [place] [address]  Phone [phone] Fax [phone]

## 2012-12-16 ENCOUNTER — Telehealth: Payer: Self-pay | Admitting: Neurology

## 2012-12-16 NOTE — Telephone Encounter (Signed)
Spoke to patient who first said her R shoulder pain was from recently prescribed meds.  A gentlemen got on the phone and said patient's pain started after she extended her arm to take her shirt off last night for bed. She claimed her shoulder popped then started hurting. 9 on pain scale. Advised patient to contact PCP or Urgent Care. Gentleman agreed, says patient has taken oxycodone and xanax this a.m., and thinks she may be a little "woozy".

## 2012-12-16 NOTE — Telephone Encounter (Signed)
I agree with the advice given. I did not call the patient.

## 2012-12-21 ENCOUNTER — Telehealth: Payer: Self-pay | Admitting: Neurology

## 2012-12-22 NOTE — Telephone Encounter (Signed)
Spoke with pt's husband; Pt was referred to their PCP to have her arm checked.

## 2012-12-24 ENCOUNTER — Other Ambulatory Visit: Payer: Self-pay | Admitting: Family Medicine

## 2012-12-24 ENCOUNTER — Ambulatory Visit
Admission: RE | Admit: 2012-12-24 | Discharge: 2012-12-24 | Disposition: A | Discharge status: Home / Self Care | Payer: Medicare Other | Source: Ambulatory Visit | Attending: Family Medicine | Admitting: Family Medicine

## 2012-12-24 DIAGNOSIS — T148XXA Other injury of unspecified body region, initial encounter: Secondary | ICD-10-CM

## 2012-12-24 DIAGNOSIS — W19XXXA Unspecified fall, initial encounter: Secondary | ICD-10-CM

## 2012-12-24 DIAGNOSIS — R52 Pain, unspecified: Secondary | ICD-10-CM

## 2012-12-30 ENCOUNTER — Telehealth: Payer: Self-pay | Admitting: Neurology

## 2012-12-30 MED ORDER — QUETIAPINE FUMARATE 25 MG PO TABS: 25.0000 mg | ORAL_TABLET | Freq: Three times a day (TID) | ORAL | Status: DC

## 2012-12-30 NOTE — Telephone Encounter (Signed)
I called and left a message for  the patient's daughter to callback to the office to speak with Jasmine December.

## 2012-12-30 NOTE — Telephone Encounter (Signed)
Patient's daughter called stating her mother hallucinations are getting worse.  Patient's daughter would like to speak with physician concerning this matter

## 2012-12-30 NOTE — Telephone Encounter (Signed)
I called the patient. I talked with the daughter. The patient is having increasing problems with hallucinations and paranoid behavior. In the past, they indicated that Seroquel did not help, Abilify could not be afforded, and Risperdal worsened of Parkinson's disease. I will start Seroquel again, taking 25 mg 3 times a day, and I will go up rapidly on the dose if needed.

## 2013-02-01 ENCOUNTER — Inpatient Hospital Stay (HOSPITAL_COMMUNITY)
Admission: EM | Admit: 2013-02-01 | Discharge: 2013-02-08 | DRG: 885 | Disposition: A | Discharge status: Home Health | Payer: Medicare Other | Attending: Family Medicine | Admitting: Family Medicine

## 2013-02-01 ENCOUNTER — Emergency Department (HOSPITAL_COMMUNITY): Payer: Medicare Other

## 2013-02-01 ENCOUNTER — Encounter (HOSPITAL_COMMUNITY): Payer: Self-pay | Admitting: Nurse Practitioner

## 2013-02-01 DIAGNOSIS — F29 Unspecified psychosis not due to a substance or known physiological condition: Secondary | ICD-10-CM

## 2013-02-01 DIAGNOSIS — E860 Dehydration: Secondary | ICD-10-CM | POA: Diagnosis present

## 2013-02-01 DIAGNOSIS — F329 Major depressive disorder, single episode, unspecified: Secondary | ICD-10-CM | POA: Diagnosis present

## 2013-02-01 DIAGNOSIS — Z8673 Personal history of transient ischemic attack (TIA), and cerebral infarction without residual deficits: Secondary | ICD-10-CM

## 2013-02-01 DIAGNOSIS — Z981 Arthrodesis status: Secondary | ICD-10-CM

## 2013-02-01 DIAGNOSIS — F3289 Other specified depressive episodes: Secondary | ICD-10-CM | POA: Diagnosis present

## 2013-02-01 DIAGNOSIS — Z01810 Encounter for preprocedural cardiovascular examination: Secondary | ICD-10-CM

## 2013-02-01 DIAGNOSIS — I10 Essential (primary) hypertension: Secondary | ICD-10-CM

## 2013-02-01 DIAGNOSIS — T428X5A Adverse effect of antiparkinsonism drugs and other central muscle-tone depressants, initial encounter: Secondary | ICD-10-CM | POA: Diagnosis present

## 2013-02-01 DIAGNOSIS — Z87891 Personal history of nicotine dependence: Secondary | ICD-10-CM

## 2013-02-01 DIAGNOSIS — R413 Other amnesia: Secondary | ICD-10-CM

## 2013-02-01 DIAGNOSIS — F02818 Dementia in other diseases classified elsewhere, unspecified severity, with other behavioral disturbance: Secondary | ICD-10-CM | POA: Diagnosis present

## 2013-02-01 DIAGNOSIS — G4733 Obstructive sleep apnea (adult) (pediatric): Secondary | ICD-10-CM | POA: Diagnosis present

## 2013-02-01 DIAGNOSIS — J45909 Unspecified asthma, uncomplicated: Secondary | ICD-10-CM | POA: Diagnosis present

## 2013-02-01 DIAGNOSIS — K219 Gastro-esophageal reflux disease without esophagitis: Secondary | ICD-10-CM | POA: Diagnosis present

## 2013-02-01 DIAGNOSIS — F0281 Dementia in other diseases classified elsewhere with behavioral disturbance: Secondary | ICD-10-CM | POA: Diagnosis present

## 2013-02-01 DIAGNOSIS — G20 Parkinson's disease: Secondary | ICD-10-CM

## 2013-02-01 DIAGNOSIS — M545 Low back pain: Secondary | ICD-10-CM

## 2013-02-01 DIAGNOSIS — Z66 Do not resuscitate: Secondary | ICD-10-CM | POA: Diagnosis present

## 2013-02-01 DIAGNOSIS — F0391 Unspecified dementia with behavioral disturbance: Secondary | ICD-10-CM | POA: Diagnosis present

## 2013-02-01 DIAGNOSIS — M48062 Spinal stenosis, lumbar region with neurogenic claudication: Secondary | ICD-10-CM

## 2013-02-01 DIAGNOSIS — F03918 Unspecified dementia, unspecified severity, with other behavioral disturbance: Secondary | ICD-10-CM | POA: Diagnosis present

## 2013-02-01 DIAGNOSIS — N289 Disorder of kidney and ureter, unspecified: Secondary | ICD-10-CM

## 2013-02-01 DIAGNOSIS — M47817 Spondylosis without myelopathy or radiculopathy, lumbosacral region: Secondary | ICD-10-CM

## 2013-02-01 DIAGNOSIS — F0280 Dementia in other diseases classified elsewhere without behavioral disturbance: Secondary | ICD-10-CM | POA: Diagnosis present

## 2013-02-01 DIAGNOSIS — F411 Generalized anxiety disorder: Secondary | ICD-10-CM | POA: Diagnosis present

## 2013-02-01 DIAGNOSIS — R269 Unspecified abnormalities of gait and mobility: Secondary | ICD-10-CM

## 2013-02-01 DIAGNOSIS — F22 Delusional disorders: Secondary | ICD-10-CM | POA: Diagnosis present

## 2013-02-01 DIAGNOSIS — Z79899 Other long term (current) drug therapy: Secondary | ICD-10-CM

## 2013-02-01 DIAGNOSIS — E43 Unspecified severe protein-calorie malnutrition: Secondary | ICD-10-CM | POA: Diagnosis present

## 2013-02-01 DIAGNOSIS — N390 Urinary tract infection, site not specified: Secondary | ICD-10-CM | POA: Diagnosis present

## 2013-02-01 DIAGNOSIS — R4182 Altered mental status, unspecified: Secondary | ICD-10-CM

## 2013-02-01 DIAGNOSIS — R443 Hallucinations, unspecified: Secondary | ICD-10-CM | POA: Diagnosis present

## 2013-02-01 LAB — COMPREHENSIVE METABOLIC PANEL
Alkaline Phosphatase: 93 U/L (ref 39–117)
BUN: 31 mg/dL — ABNORMAL HIGH (ref 6–23)
GFR calc Af Amer: 53 mL/min — ABNORMAL LOW (ref >90)
Glucose, Bld: 112 mg/dL — ABNORMAL HIGH (ref 70–99)
Potassium: 4.1 mEq/L (ref 3.5–5.1)
Total Bilirubin: 0.4 mg/dL (ref 0.3–1.2)
Total Protein: 6.4 g/dL (ref 6.0–8.3)

## 2013-02-01 LAB — URINALYSIS, ROUTINE W REFLEX MICROSCOPIC
Ketones, ur: NEGATIVE mg/dL
Nitrite: NEGATIVE
Protein, ur: 30 mg/dL — AB
Urobilinogen, UA: 0.2 mg/dL (ref 0.0–1.0)
pH: 6.5 (ref 5.0–8.0)

## 2013-02-01 LAB — CBC
HCT: 30.5 % — ABNORMAL LOW (ref 36.0–46.0)
Hemoglobin: 10.7 g/dL — ABNORMAL LOW (ref 12.0–15.0)
MCHC: 35.1 g/dL (ref 30.0–36.0)

## 2013-02-01 LAB — GLUCOSE, CAPILLARY: Glucose-Capillary: 104 mg/dL — ABNORMAL HIGH (ref 70–99)

## 2013-02-01 MED ORDER — SODIUM CHLORIDE 0.9 % IV BOLUS (SEPSIS)
500.0000 mL | Freq: Once | INTRAVENOUS | Status: AC
  Administered 2013-02-01: 500 mL via INTRAVENOUS

## 2013-02-01 NOTE — ED Provider Notes (Signed)
Medical screening examination/treatment/procedure(s) were conducted as a shared visit with non-physician practitioner(s) and myself.  I personally evaluated the patient during the encounter and agree with physical exam and plan of care.    Layla Maw Lleyton Byers, DO 02/01/13 2022

## 2013-02-01 NOTE — ED Provider Notes (Signed)
Foot 3, views.  She's had worsening renal function is clinically dehydrated.  She is altered and hallucinating.  She's been recently stopped restarted on Seroquel at 25 mg 3 times a day per Dr. Clarisa Kindred notes.  This was to be rapidly increased for symptom control.  She's also been hallucinating, seeing snakes.  This frightened her.  She gets up out of her wheelchair and then, falls.  She's had 2 falls recently in the last 3, days.  Head CT was reviewed and there is no fracture, subdural hematoma, or bleed.  Arman Filter, NP 02/01/13 [phone]

## 2013-02-01 NOTE — ED Provider Notes (Signed)
Medical screening examination/treatment/procedure(s) were conducted as a shared visit with non-physician practitioner(s) and myself.  I personally evaluated the patient during the encounter and agree with physical exam and plan of care.  Layla Maw Bulah Lurie, DO 02/01/13 2023

## 2013-02-01 NOTE — ED Provider Notes (Signed)
CSN: [id number]     Arrival date & time 02/01/13  1747 History   First MD Initiated Contact with Patient 02/01/13 1845     Chief Complaint  Patient presents with  . Hallucinations   (Consider location/radiation/quality/duration/timing/severity/associated sxs/prior Treatment) HPI Comments: Patient is a 67 year old female with a past medical history of parkinson's disease, dementia, depression/anxiety, previous CVA who presents with worsening mental status. Patient's husband and daughter are at the bedside who provide the history. Patient's daughter states her mother has had Dementia and Parkinson's for a while now but has been getting progressively worse lately, mostly in the past few days. Patient's husband reports patient thinks there are snakes in the house and she will run away from them around the house and fall due to unsteady gait. Patient's daughter reports patient has fell twice this evening while she was visiting. Patient's husband also states the patient will get up in the middle of the night and go outside and he will have to go find her. Patient also refuses to eat because she thinks "the kids aren't being fed." No aggravating/alleviating factors. No associated symptoms. Patient has been undergoing medication changes over the past year to better manage her dementia and parkinson's.    Past Medical History  Diagnosis Date  . Spondylolisthesis   . Lumbar stenosis   . Depression   . Parkinson disease   . Asthma   . Hemangioma     left posterior  . Hypertension   . CVA (cerebrovascular accident)     pts lawyer stold her she had a stroke  . Sleep apnea     does not use a cpap machine  . Blood transfusion 1976  . Chronic UTI   . Hepatitis 1973    hepatitis A ?  Marland Kitchen GERD (gastroesophageal reflux disease)   . Anxiety   . Lumbar radiculopathy     parkinsons  . Memory disorder   . Gait disorder   . Obstructive sleep apnea   . History of seizures   . Gout   . Organic heart  disease, NYHA class 1   . History of frequent urinary tract infections   . Chronic back pain   . Hearing deficit    Past Surgical History  Procedure Laterality Date  . Laminectomy    . Cardiac catheterization  2003     Angiographically patent coronary arteries with luminal irregularities in mid left anterior descending coronary artery and mid right coronary  artery. Normal left ventricular systolic function, ejection fraction estimate of  60 to 65% Normal abdominal aorta.   Normal renal arteries.  . Abdominal hysterectomy    . Foot surgery    . Anal fissure surgery    . Appendectomy    . Back surgery    . Lumbar fusion  10/23/2011   Family History  Problem Relation Age of Onset  . Coronary artery disease    . Anesthesia problems Neg Hx   . Cancer Mother   . Kidney cancer Mother   . Heart disease Mother   . Depression Mother   . Heart disease Father   . Brain cancer Sister   . Depression Sister   . Diabetes Brother   . Diabetes Sister   . Depression Other   . Depression Son    History  Substance Use Topics  . Smoking status: Former Games developer  . Smokeless tobacco: Never Used  . Alcohol Use: No   OB History   Grav Para Term Preterm  Abortions TAB SAB Ect Mult Living                 Review of Systems  Psychiatric/Behavioral: Positive for hallucinations and agitation.  All other systems reviewed and are negative.    Allergies  Estrogens; Tramadol; Hydromorphone; and Neurontin  Home Medications   Current Outpatient Rx  Name  Route  Sig  Dispense  Refill  . ALPRAZolam (XANAX) 1 MG tablet   Oral   Take 0.5-1 mg by mouth 3 (three) times daily as needed for anxiety.          Marland Kitchen amantadine (SYMMETREL) 100 MG capsule   Oral   Take 1 capsule (100 mg total) by mouth 2 (two) times daily.   60 capsule   5   . carbidopa-levodopa-entacapone (STALEVO) 31.25-125-200 MG per tablet   Oral   Take 1 tablet by mouth 3 (three) times daily. Take at 0800, 1200, 1600, and 2000          . furosemide (LASIX) 20 MG tablet   Oral   Take 20 mg by mouth daily.           Marland Kitchen losartan (COZAAR) 100 MG tablet   Oral   Take 100 mg by mouth 2 (two) times daily.          . metoprolol tartrate (LOPRESSOR) 25 MG tablet   Oral   Take 25 mg by mouth 2 (two) times daily.         . nitrofurantoin (MACRODANTIN) 100 MG capsule   Oral   Take 100 mg by mouth at bedtime.           BP 149/53  Pulse 70  Temp(Src) 97.6 F (36.4 C) (Oral)  Resp 20  SpO2 96% Physical Exam  Nursing note and vitals reviewed. Constitutional: She appears well-developed and well-nourished. No distress.  HENT:  Head: Normocephalic and atraumatic.  Eyes: Conjunctivae and EOM are normal.  Neck: Normal range of motion.  Cardiovascular: Normal rate and regular rhythm.  Exam reveals no gallop and no friction rub.   No murmur heard. Pulmonary/Chest: Effort normal and breath sounds normal. She has no wheezes. She has no rales. She exhibits no tenderness.  Abdominal: Soft. She exhibits no distension. There is no tenderness. There is no rebound and no guarding.  Musculoskeletal: Normal range of motion.  Neurological: She is alert. Coordination normal.  Speech is goal-oriented. Moves limbs without ataxia.   Skin: Skin is warm and dry.  Psychiatric: Her behavior is normal.  Patient has occasional comments that indicate hallucinations such as "the kids in the car have to be fed"    ED Course  Procedures (including critical care time) Labs Review Labs Reviewed  CBC - Abnormal; Notable for the following:    RBC 3.20 (*)    Hemoglobin 10.7 (*)    HCT 30.5 (*)    Platelets 128 (*)    All other components within normal limits  COMPREHENSIVE METABOLIC PANEL - Abnormal; Notable for the following:    Glucose, Bld 112 (*)    BUN 31 (*)    Creatinine, Ser 1.21 (*)    GFR calc non Af Amer 46 (*)    GFR calc Af Amer 53 (*)    All other components within normal limits  URINALYSIS, ROUTINE W REFLEX  MICROSCOPIC - Abnormal; Notable for the following:    Protein, ur 30 (*)    Leukocytes, UA TRACE (*)    All other components within normal limits  GLUCOSE, CAPILLARY - Abnormal; Notable for the following:    Glucose-Capillary 104 (*)    All other components within normal limits  URINE MICROSCOPIC-ADD ON - Abnormal; Notable for the following:    Bacteria, UA FEW (*)    All other components within normal limits  CBC WITH DIFFERENTIAL - Abnormal; Notable for the following:    RBC 3.01 (*)    Hemoglobin 10.3 (*)    HCT 28.7 (*)    MCH 34.2 (*)    All other components within normal limits  COMPREHENSIVE METABOLIC PANEL - Abnormal; Notable for the following:    Chloride 113 (*)    Glucose, Bld 106 (*)    Total Protein 5.5 (*)    Albumin 3.4 (*)    GFR calc non Af Amer 66 (*)    GFR calc Af Amer 76 (*)    All other components within normal limits  URINE CULTURE   Imaging Review Ct Head Wo Contrast  02/01/2013   *RADIOLOGY REPORT*  Clinical Data: Mental status.  Hallucinations.  CT HEAD WITHOUT CONTRAST  Technique:  Contiguous axial images were obtained from the base of the skull through the vertex without contrast.  Comparison: No priors.  Findings: There are patchy multifocal areas of decreased attenuation throughout the deep and periventricular white matter of the cerebral hemispheres bilaterally, favored to reflect chronic microvascular ischemic disease. No acute intracranial abnormalities.  Specifically, no evidence of acute intracranial hemorrhage, no definite findings of acute/subacute cerebral ischemia, no mass, mass effect, hydrocephalus or abnormal intra or extra-axial fluid collections.  Visualized paranasal sinuses and mastoids are well pneumatized.  No acute displaced skull fractures are identified.  IMPRESSION: 1.  No acute intracranial abnormalities. 2.  Chronic ischemic changes throughout the deep and periventricular white matter of the cerebral hemispheres bilaterally.   Original  Report Authenticated By: Trudie Reed, M.D.    MDM   1. Altered mental status   2. Renal insufficiency, mild   3. Dehydration   4. Hallucination   5. Abnormality of gait   6. Diabetes mellitus   7. Psychosis     7:43 PM Labs, urinalysis and head CT pending. Vitals stable and patient afebrile.   8:13 PM Patient signed out to Earley Favor, NP.   Emilia Beck, [place] 02/03/13 [phone]

## 2013-02-01 NOTE — Progress Notes (Signed)
Reviewed patient's chart. Attempted to get report but there was no answer.  Will call again in 5 minutes.

## 2013-02-01 NOTE — ED Notes (Addendum)
Pt has parkinsons and dementia. Pt has been having hallucinations and PCP believes it is side effect of one of her parkinsons medications. Daughter states this week hallucinations have "gotten out of control." daughter states her father has been unable to take care of the pt at home because she has been running around the house "chasing things she thinks she sees." pt is somewhat oriented, answers some questions appropriately but is also talking nonsense. She called 911 last week because she thought someone was in her house with a gun and they found her hiding in the closet. Daughter states the pt has not tried to harm herself or anyone else. States the pt is fearful of being harmed

## 2013-02-02 ENCOUNTER — Encounter (HOSPITAL_COMMUNITY): Payer: Self-pay | Admitting: Internal Medicine

## 2013-02-02 DIAGNOSIS — F0630 Mood disorder due to known physiological condition, unspecified: Secondary | ICD-10-CM

## 2013-02-02 DIAGNOSIS — R443 Hallucinations, unspecified: Secondary | ICD-10-CM

## 2013-02-02 DIAGNOSIS — E119 Type 2 diabetes mellitus without complications: Secondary | ICD-10-CM

## 2013-02-02 DIAGNOSIS — E860 Dehydration: Secondary | ICD-10-CM

## 2013-02-02 MED ORDER — LORAZEPAM 2 MG/ML IJ SOLN
0.5000 mg | Freq: Once | INTRAMUSCULAR | Status: AC
  Administered 2013-02-02: 0.5 mg via INTRAVENOUS
  Filled 2013-02-02: qty 1

## 2013-02-02 MED ORDER — SODIUM CHLORIDE 0.9 % IV SOLN
INTRAVENOUS | Status: AC
  Administered 2013-02-02: 21:00:00 1000 mL via INTRAVENOUS
  Administered 2013-02-02: 14:00:00 via INTRAVENOUS

## 2013-02-02 MED ORDER — METOPROLOL TARTRATE 25 MG PO TABS
25.0000 mg | ORAL_TABLET | Freq: Two times a day (BID) | ORAL | Status: DC
  Administered 2013-02-02 – 2013-02-08 (×12): 25 mg via ORAL
  Filled 2013-02-02 (×15): qty 1

## 2013-02-02 MED ORDER — ACETAMINOPHEN 650 MG RE SUPP: 650.0000 mg | Freq: Four times a day (QID) | RECTAL | Status: DC | PRN

## 2013-02-02 MED ORDER — ENTACAPONE 200 MG PO TABS
200.0000 mg | ORAL_TABLET | Freq: Three times a day (TID) | ORAL | Status: DC
  Administered 2013-02-02 (×3): 200 mg via ORAL
  Filled 2013-02-02 (×7): qty 1

## 2013-02-02 MED ORDER — MIRTAZAPINE 15 MG PO TBDP
15.0000 mg | ORAL_TABLET | Freq: Every day | ORAL | Status: DC
  Administered 2013-02-02 – 2013-02-07 (×6): 15 mg via ORAL
  Filled 2013-02-02 (×7): qty 1

## 2013-02-02 MED ORDER — ENOXAPARIN SODIUM 30 MG/0.3ML ~~LOC~~ SOLN
30.0000 mg | Freq: Every day | SUBCUTANEOUS | Status: DC
  Administered 2013-02-02 – 2013-02-04 (×2): 30 mg via SUBCUTANEOUS
  Filled 2013-02-02 (×3): qty 0.3

## 2013-02-02 MED ORDER — HYDRALAZINE HCL 20 MG/ML IJ SOLN: 10.0000 mg | INTRAMUSCULAR | Status: DC | PRN

## 2013-02-02 MED ORDER — ONDANSETRON HCL 4 MG/2ML IJ SOLN
4.0000 mg | Freq: Four times a day (QID) | INTRAMUSCULAR | Status: DC | PRN
  Administered 2013-02-04: 4 mg via INTRAVENOUS
  Filled 2013-02-02: qty 2

## 2013-02-02 MED ORDER — LORAZEPAM 2 MG/ML IJ SOLN
1.0000 mg | INTRAMUSCULAR | Status: DC | PRN
  Administered 2013-02-02 – 2013-02-06 (×5): 1 mg via INTRAVENOUS
  Filled 2013-02-02 (×6): qty 1

## 2013-02-02 MED ORDER — NITROFURANTOIN MACROCRYSTAL 100 MG PO CAPS
100.0000 mg | ORAL_CAPSULE | Freq: Every day | ORAL | Status: DC
  Administered 2013-02-02 – 2013-02-07 (×6): 100 mg via ORAL
  Filled 2013-02-02 (×8): qty 1

## 2013-02-02 MED ORDER — SODIUM CHLORIDE 0.9 % IV SOLN
INTRAVENOUS | Status: AC
  Administered 2013-02-02: 02:00:00 via INTRAVENOUS

## 2013-02-02 MED ORDER — AMANTADINE HCL 100 MG PO CAPS
100.0000 mg | ORAL_CAPSULE | Freq: Two times a day (BID) | ORAL | Status: DC
  Administered 2013-02-02 – 2013-02-05 (×6): 100 mg via ORAL
  Filled 2013-02-02 (×9): qty 1

## 2013-02-02 MED ORDER — ALPRAZOLAM 0.5 MG PO TABS
0.5000 mg | ORAL_TABLET | Freq: Three times a day (TID) | ORAL | Status: DC | PRN
  Administered 2013-02-02 (×2): 1 mg via ORAL
  Filled 2013-02-02 (×2): qty 2

## 2013-02-02 MED ORDER — CARBIDOPA-LEVODOPA-ENTACAPONE 31.25-125-200 MG PO TABS: 1.0000 | ORAL_TABLET | Freq: Three times a day (TID) | ORAL | Status: DC

## 2013-02-02 MED ORDER — ONDANSETRON HCL 4 MG PO TABS
4.0000 mg | ORAL_TABLET | Freq: Four times a day (QID) | ORAL | Status: DC | PRN
  Administered 2013-02-02 – 2013-02-07 (×2): 4 mg via ORAL
  Filled 2013-02-02 (×2): qty 1

## 2013-02-02 MED ORDER — RISPERIDONE 0.5 MG PO TBDP
0.5000 mg | ORAL_TABLET | Freq: Two times a day (BID) | ORAL | Status: DC
  Administered 2013-02-02 – 2013-02-03 (×3): 0.5 mg via ORAL
  Filled 2013-02-02 (×4): qty 1

## 2013-02-02 MED ORDER — ACETAMINOPHEN 325 MG PO TABS
650.0000 mg | ORAL_TABLET | Freq: Four times a day (QID) | ORAL | Status: DC | PRN
  Administered 2013-02-02: 650 mg via ORAL
  Filled 2013-02-02: qty 2

## 2013-02-02 MED ORDER — CARBIDOPA-LEVODOPA 25-100 MG PO TABS
1.0000 | ORAL_TABLET | Freq: Three times a day (TID) | ORAL | Status: DC
  Administered 2013-02-02 (×3): 1 via ORAL
  Filled 2013-02-02 (×7): qty 1

## 2013-02-02 NOTE — ED Provider Notes (Signed)
Medical screening examination/treatment/procedure(s) were conducted as a shared visit with non-physician practitioner(s) and myself.  I personally evaluated the patient during the encounter and agree with physical exam and plan of care.  Layla Maw Ward, DO 02/02/13 1055

## 2013-02-02 NOTE — Consult Note (Signed)
Reason for Consult: Dementia Referring Physician: Dr. Garret Reddish is an 67 y.o. female.  HPI: Patient is seen and chart reviewed. Case is discussed with patient daughter who was at bedside. Reportedly patient has been suffering with visual hallucinations and paranoia probably secondary to Parkinson's medication. Patient primary care doctor's provided antipsychotic medication Seroquel 25 mg 3 times a day with limited benefit which was later changed to Zyprexa 15 mg at bedtime. Patient husband and daughter reported medication is not helping her either to sleep up to control her hallucinations. Reportedly patient has been seeing people coming off of the wall and status are coming towards her to hurt her. Reportedly patient's husband also also found to have increasing difficulty managing patient in the house.   MSE: Patient knows of her first name and last name. Patient has moderate to severe cognitive deficits including memory loss. Patient trying to get out of her bed and trying to walk out of this hospital. Patient was not able to converse in a meaningful way.  Past Medical History  Diagnosis Date  . Spondylolisthesis   . Lumbar stenosis   . Depression   . Parkinson disease   . Asthma   . Hemangioma     left posterior  . Hypertension   . CVA (cerebrovascular accident)     pts lawyer stold her she had a stroke  . Sleep apnea     does not use a cpap machine  . Blood transfusion 1976  . Chronic UTI   . Hepatitis 1973    hepatitis A ?  Marland Kitchen GERD (gastroesophageal reflux disease)   . Anxiety   . Lumbar radiculopathy     parkinsons  . Memory disorder   . Gait disorder   . Obstructive sleep apnea   . History of seizures   . Gout   . Organic heart disease, NYHA class 1   . History of frequent urinary tract infections   . Chronic back pain   . Hearing deficit     Past Surgical History  Procedure Laterality Date  . Laminectomy    . Cardiac catheterization  2003   Angiographically patent coronary arteries with luminal irregularities in mid left anterior descending coronary artery and mid right coronary  artery. Normal left ventricular systolic function, ejection fraction estimate of  60 to 65% Normal abdominal aorta.   Normal renal arteries.  . Abdominal hysterectomy    . Foot surgery    . Anal fissure surgery    . Appendectomy    . Back surgery    . Lumbar fusion  10/23/2011    Family History  Problem Relation Age of Onset  . Coronary artery disease    . Anesthesia problems Neg Hx   . Cancer Mother   . Kidney cancer Mother   . Heart disease Mother   . Depression Mother   . Heart disease Father   . Brain cancer Sister   . Depression Sister   . Diabetes Brother   . Diabetes Sister   . Depression Other   . Depression Son     Social History:  reports that she has quit smoking. She has never used smokeless tobacco. She reports that she does not drink alcohol or use illicit drugs.  Allergies:  Allergies  Allergen Reactions  . Estrogens Hives and Swelling  . Tramadol     Unsure of reaction per patient  . Hydromorphone Rash  . Neurontin [Gabapentin] Other (See Comments)    Urinary  problems    Medications: I have reviewed the patient's current medications.  Results for orders placed during the hospital encounter of 02/01/13 (from the past 48 hour(s))  CBC     Status: Abnormal   Collection Time    02/01/13  7:28 PM      Result Value Range   WBC 7.6  4.0 - 10.5 K/uL   RBC 3.20 (*) 3.87 - 5.11 MIL/uL   Hemoglobin 10.7 (*) 12.0 - 15.0 g/dL   HCT 19.1 (*) 47.8 - 29.5 %   MCV 95.3  78.0 - 100.0 fL   MCH 33.4  26.0 - 34.0 pg   MCHC 35.1  30.0 - 36.0 g/dL   RDW 62.1  30.8 - 65.7 %   Platelets 128 (*) 150 - 400 K/uL  COMPREHENSIVE METABOLIC PANEL     Status: Abnormal   Collection Time    02/01/13  7:28 PM      Result Value Range   Sodium 143  135 - 145 mEq/L   Potassium 4.1  3.5 - 5.1 mEq/L   Chloride 109  96 - 112 mEq/L   CO2 22   19 - 32 mEq/L   Glucose, Bld 112 (*) 70 - 99 mg/dL   BUN 31 (*) 6 - 23 mg/dL   Creatinine, Ser 8.46 (*) 0.50 - 1.10 mg/dL   Calcium 9.7  8.4 - 96.2 mg/dL   Total Protein 6.4  6.0 - 8.3 g/dL   Albumin 4.0  3.5 - 5.2 g/dL   AST 18  0 - 37 U/L   ALT 6  0 - 35 U/L   Alkaline Phosphatase 93  39 - 117 U/L   Total Bilirubin 0.4  0.3 - 1.2 mg/dL   GFR calc non Af Amer 46 (*) >90 mL/min   GFR calc Af Amer 53 (*) >90 mL/min   Comment: (NOTE)     The eGFR has been calculated using the CKD EPI equation.     This calculation has not been validated in all clinical situations.     eGFR's persistently <90 mL/min signify possible Chronic Kidney     Disease.  GLUCOSE, CAPILLARY     Status: Abnormal   Collection Time    02/01/13  9:18 PM      Result Value Range   Glucose-Capillary 104 (*) 70 - 99 mg/dL   Comment 1 Notify RN     Comment 2 Documented in Chart    URINALYSIS, ROUTINE W REFLEX MICROSCOPIC     Status: Abnormal   Collection Time    02/01/13 10:00 PM      Result Value Range   Color, Urine YELLOW  YELLOW   APPearance CLEAR  CLEAR   Specific Gravity, Urine 1.012  1.005 - 1.030   pH 6.5  5.0 - 8.0   Glucose, UA NEGATIVE  NEGATIVE mg/dL   Hgb urine dipstick NEGATIVE  NEGATIVE   Bilirubin Urine NEGATIVE  NEGATIVE   Ketones, ur NEGATIVE  NEGATIVE mg/dL   Protein, ur 30 (*) NEGATIVE mg/dL   Urobilinogen, UA 0.2  0.0 - 1.0 mg/dL   Nitrite NEGATIVE  NEGATIVE   Leukocytes, UA TRACE (*) NEGATIVE  URINE MICROSCOPIC-ADD ON     Status: Abnormal   Collection Time    02/01/13 10:00 PM      Result Value Range   Squamous Epithelial / LPF RARE  RARE   WBC, UA 0-2  <3 WBC/hpf   Bacteria, UA FEW (*) RARE    Ct  Head Wo Contrast  02/01/2013   *RADIOLOGY REPORT*  Clinical Data: Mental status.  Hallucinations.  CT HEAD WITHOUT CONTRAST  Technique:  Contiguous axial images were obtained from the base of the skull through the vertex without contrast.  Comparison: No priors.  Findings: There are patchy  multifocal areas of decreased attenuation throughout the deep and periventricular white matter of the cerebral hemispheres bilaterally, favored to reflect chronic microvascular ischemic disease. No acute intracranial abnormalities.  Specifically, no evidence of acute intracranial hemorrhage, no definite findings of acute/subacute cerebral ischemia, no mass, mass effect, hydrocephalus or abnormal intra or extra-axial fluid collections.  Visualized paranasal sinuses and mastoids are well pneumatized.  No acute displaced skull fractures are identified.  IMPRESSION: 1.  No acute intracranial abnormalities. 2.  Chronic ischemic changes throughout the deep and periventricular white matter of the cerebral hemispheres bilaterally.   Original Report Authenticated By: Trudie Reed, M.D.    Positive for behavior problems, sleep disturbance and Visual hallucinations Blood pressure 194/75, pulse 58, temperature 97.8 F (36.6 C), temperature source Oral, resp. rate 20, weight 43.092 kg (95 lb), SpO2 98.00%.   Assessment/Plan: Psychosis secondary to [facility] (Parkinson disease)  Recommendation:  Patient does not have capacity to make her own medical decisions and living arrangements Patient does not meet criteria for acute psychiatric hospitalization  Medication treatment discussed with the patient Daughter was at bedside  Start Risperidone M-tab 0.5 mg PO BID which can be titrated 1 mg twice a day if needed Start Remeron 15 mg PO Qha Discontinue Olanzapine  Appreciate psych consult and will sign off at this time  Nehemiah Settle., MD 02/02/2013, 4:12 PM

## 2013-02-02 NOTE — Clinical Social Work Psychosocial (Signed)
Clinical Social Work Department BRIEF PSYCHOSOCIAL ASSESSMENT 02/02/2013  Patient:  [name], [name]     Account Number:  [id number]     Admit date:  02/01/2013  Clinical Social Worker:  Delmer Islam  Date/Time:  02/02/2013 04:13 AM  Referred by:  Physician  Date Referred:  02/02/2013 Referred for  SNF Placement   Other Referral:   Psych evaluation   Interview type:  Family Other interview type:   CSW talked with attending MD and briefly with patient.    PSYCHOSOCIAL DATA Living Status:  HUSBAND Admitted from facility:   Level of care:   Primary support name:  Asencion Noble Primary support relationship to patient:  SPOUSE Degree of support available:   Husband very devoted to his wife. He is the primary caregiver and reported that he can no longer work as patient requires 24 hr supervision.    CURRENT CONCERNS Current Concerns  Post-Acute Placement   Other Concerns:   Husband concerned about patient getting the appropriate medication.    SOCIAL WORK ASSESSMENT / PLAN CSW received consult regarding SNF placement. CSW reviewed patients chart and due to hallucinations and behaviors needs 24 hr supervision, as reported by spouse. CSW consulted with attending MD regarding the appropriateness of SNF placement at this time and the need for a psych evaluation. MD agreed and placed psych consult order.    CSW visited with patient and husband and observed that husband and nurse were having a difficult time controlling patient agitation and preventing self injurious behavior due to her restlessness and agitation. CSW talked with husband about possible discharge options which includes a psych evaluation, possible placement at a Geri-psych facility or a locked nursing facility due to patient's ongoing hallucinations, restless/agitated behavior and need for 24 hr supervision. Husband expressed concern for patient's current medication regimen and wanted to speak with the patient's  attending.   Assessment/plan status:  Psychosocial Support/Ongoing Assessment of Needs Other assessment/ plan:   Patient not appropriate for SNF at this time due to current behaviors and hallucinations. Patient currently needs 24 hr supervision to remain safe.   Information/referral to community resources:    PATIENT'S/FAMILY'S RESPONSE TO PLAN OF CARE: Husband was kind and open to speaking with CSW. He expressed understanding of conversation with CSW and is very concerned about the medications he feels patient needs to be taking. CSW advised husband that he will be kept updated regarding discharge plans for patient, based on her progress.

## 2013-02-02 NOTE — Evaluation (Addendum)
Physical Therapy Evaluation Patient Details Name: [name] MRN: [id number] DOB: 12/05/1945 Today's Date: 02/02/2013 Time: 1010-1033 PT Time Calculation (min): 23 min  PT Assessment / Plan / Recommendation History of Present Illness  67 y.o. female history of Parkinson's and dementia, hypertension was brought to the ER because patient was found to have increasing hallucination. Patient also was found to have decreased oral intake. As per the husband patient's neurologist Dr. Anne Hahn had recommended Seroquel 3 weeks ago which did not make much difference and was started on olanzapine by patient's PCP as per the husband. Patient's husband is planning to bring her medications in the morning. In addition patient is found to have poor appetite with labs showing mild dehydration. Patient's husband also also found to have increasing difficulty managing patient in the house. Patient otherwise did not have any chest pain shortness of breath abdominal pain nausea vomiting or diarrhea.  Clinical Impression  Pt admitted with hallucinations. Pt currently with functional limitations due to the deficits listed below (see PT Problem List). Pt will benefit from skilled PT to increase their independence and safety with mobility to allow discharge to the venue listed below.  Spouse reports difficulty managing pt at home since hallucinations and adjustments to meds (approx one month).  Spouse states he cannot leave her alone for a minute due to hallucinations which also tell her to do things and due to falls.  Spouse open to considering ST-SNF upon d/c.    PT Assessment  Patient needs continued PT services    Follow Up Recommendations  Supervision/Assistance - 24 hour;SNF    Does the patient have the potential to tolerate intense rehabilitation      Barriers to Discharge        Equipment Recommendations  None recommended by PT    Recommendations for Other Services     Frequency Min 3X/week    Precautions  / Restrictions Precautions Precautions: Fall Restrictions Weight Bearing Restrictions: No   Pertinent Vitals/Pain n/a      Mobility  Bed Mobility Bed Mobility: Supine to Sit;Sit to Supine Supine to Sit: 2: Max assist;HOB elevated Sit to Supine: 3: Mod assist;HOB elevated Details for Bed Mobility Assistance: spouse assisted pt's LEs and then trunk to EOB providing verbal cues however pt very lethargic after meds per spouse so he states she required more assist than usual, assist for LEs onto bed Transfers Transfers: Sit to Stand;Stand to Sit;Stand Pivot Transfers Sit to Stand: 4: Min assist;From bed;From chair/3-in-1 Stand to Sit: 4: Min assist;To chair/3-in-1;To bed Stand Pivot Transfers: 4: Min assist Details for Transfer Assistance: spouse assisted pt with stand pivot transfer, pt held onto spouse's arms, PT guided pt's hips to/from [facility], spouse also assisted with hygiene however reports pt usually likes to perform herself Ambulation/Gait Ambulation/Gait Assistance: Not tested (comment) (due to lethargy)    Exercises     PT Diagnosis: Difficulty walking  PT Problem List: Decreased strength;Decreased mobility;Decreased balance;Decreased knowledge of use of DME;Decreased cognition PT Treatment Interventions: DME instruction;Gait training;Functional mobility training;Therapeutic activities;Therapeutic exercise;Patient/family education;Balance training;Neuromuscular re-education     PT Goals(Current goals can be found in the care plan section) Acute Rehab PT Goals PT Goal Formulation: With patient/family Time For Goal Achievement: 02/16/13 Potential to Achieve Goals: Fair  Visit Information  Last PT Received On: 02/02/13 Assistance Needed: +1 History of Present Illness: 67 y.o. female history of Parkinson's and dementia, hypertension was brought to the ER because patient was found to have increasing hallucination. Patient also was found  to have decreased oral intake. As per the  husband patient's neurologist Dr. Anne Hahn had recommended Seroquel 3 weeks ago which did not make much difference and was started on olanzapine by patient's PCP as per the husband. Patient's husband is planning to bring her medications in the morning. In addition patient is found to have poor appetite with labs showing mild dehydration. Patient's husband also also found to have increasing difficulty managing patient in the house. Patient otherwise did not have any chest pain shortness of breath abdominal pain nausea vomiting or diarrhea.       Prior Functioning  Home Living Family/patient expects to be discharged to:: Private residence Living Arrangements: Spouse/significant other Available Help at Discharge: Family;Available 24 hours/day Type of Home: Mobile home Home Access: Ramped entrance Home Layout: One level Home Equipment: Wheelchair - [employer] - 2 wheels;Bedside commode Prior Function Level of Independence: Needs assistance Gait / Transfers Assistance Needed: spouse reports pt has required significantly more assist in last month since medication change, pt has been hallucinating and falling and he reports she requires 24/7 supervision for safety however prior to this she was ambulatory and performed ADLs without difficulty, occasional assist Comments: spouse reports pt ambulatory without assistive device prior to a month ago however due to falls and hallucinations, more recently using w/c for mobility Communication Communication: No difficulties    Cognition  Cognition Arousal/Alertness: Lethargic;Suspect due to medications Behavior During Therapy: Flat affect Overall Cognitive Status: Impaired/Different from baseline Area of Impairment: Safety/judgement Safety/Judgement: Decreased awareness of safety General Comments: spouse reports complete supervision for safety due to hallucination and falls (prior intermittent supervision)    Extremity/Trunk Assessment Lower Extremity  Assessment Lower Extremity Assessment: Generalized weakness   Balance    End of Session PT - End of Session Activity Tolerance: Patient limited by lethargy Patient left: in bed;with call bell/phone within reach;with family/visitor present (spouse reports he will notify staff if leaving room,) spouse declined bed alarm  GP     Maclane Holloran,KATHrine E 02/02/2013, 11:26 AM Zenovia Jarred, PT, DPT 02/02/2013 Pager: [phone]

## 2013-02-02 NOTE — Progress Notes (Signed)
Continues to be  Agitated ,confused, hallucinating,so prn ativan given.

## 2013-02-02 NOTE — H&P (Signed)
[facility] History and Physical  ZERIAH Brown ZOX:[id number] DOB: 01/12/1946 DOA: 02/01/2013  Referring physician: ER physician. PCP: Aida Puffer, MD  Specialists: Dr. Anne Hahn. Neurologist.  Chief Complaint: Hallucinations. History obtained from patient's husband and the ER physician.  HPI: [name] is a 67 y.o. female history of Parkinson's and dementia, hypertension was brought to the ER because patient was found to have increasing hallucination. Patient also was found to have decreased oral intake. As per the husband patient's neurologist Dr. Anne Hahn had recommended Seroquel 3 weeks ago which did not make much difference and was started on olanzapine by patient's PCP as per the husband. Patient's husband is planning to bring her medications in the morning. In addition patient is found to have poor appetite with labs showing mild dehydration. Patient's husband also also found to have increasing difficulty managing patient in the house. Patient otherwise did not have any chest pain shortness of breath abdominal pain nausea vomiting or diarrhea.  Review of Systems: As presented in the history of presenting illness, rest negative.  Past Medical History  Diagnosis Date  . Spondylolisthesis   . Lumbar stenosis   . Depression   . Parkinson disease   . Asthma   . Hemangioma     left posterior  . Hypertension   . CVA (cerebrovascular accident)     pts lawyer stold her she had a stroke  . Sleep apnea     does not use a cpap machine  . Blood transfusion 1976  . Chronic UTI   . Hepatitis 1973    hepatitis A ?  Marland Kitchen GERD (gastroesophageal reflux disease)   . Anxiety   . Lumbar radiculopathy     parkinsons  . Memory disorder   . Gait disorder   . Obstructive sleep apnea   . History of seizures   . Gout   . Organic heart disease, NYHA class 1   . History of frequent urinary tract infections   . Chronic back pain   . Hearing deficit    Past Surgical History  Procedure  Laterality Date  . Laminectomy    . Cardiac catheterization  2003     Angiographically patent coronary arteries with luminal irregularities in mid left anterior descending coronary artery and mid right coronary  artery. Normal left ventricular systolic function, ejection fraction estimate of  60 to 65% Normal abdominal aorta.   Normal renal arteries.  . Abdominal hysterectomy    . Foot surgery    . Anal fissure surgery    . Appendectomy    . Back surgery    . Lumbar fusion  10/23/2011   Social History:  reports that she has quit smoking. She has never used smokeless tobacco. She reports that she does not drink alcohol or use illicit drugs. Home. where does patient live-- No. Can patient participate in ADLs?  Allergies  Allergen Reactions  . Estrogens Hives and Swelling  . Tramadol     Unsure of reaction per patient  . Hydromorphone Rash  . Neurontin [Gabapentin] Other (See Comments)    Urinary problems    Family History  Problem Relation Age of Onset  . Coronary artery disease    . Anesthesia problems Neg Hx   . Cancer Mother   . Kidney cancer Mother   . Heart disease Mother   . Depression Mother   . Heart disease Father   . Brain cancer Sister   . Depression Sister   . Diabetes Brother   .  Diabetes Sister   . Depression Other   . Depression Son       Prior to Admission medications   Medication Sig Start Date End Date Taking? Authorizing Provider  ALPRAZolam Prudy Feeler) 1 MG tablet Take 0.5-1 mg by mouth 3 (three) times daily as needed for anxiety.    Yes Historical Provider, MD  amantadine (SYMMETREL) 100 MG capsule Take 1 capsule (100 mg total) by mouth 2 (two) times daily. 12/15/12  Yes York Spaniel, MD  carbidopa-levodopa-entacapone (STALEVO) 31.25-125-200 MG per tablet Take 1 tablet by mouth 3 (three) times daily. Take at 0800, 1200, 1600, and 2000   Yes Historical Provider, MD  furosemide (LASIX) 20 MG tablet Take 20 mg by mouth daily.     Yes Historical Provider,  MD  losartan (COZAAR) 100 MG tablet Take 100 mg by mouth 2 (two) times daily.    Yes Historical Provider, MD  metoprolol tartrate (LOPRESSOR) 25 MG tablet Take 25 mg by mouth 2 (two) times daily.   Yes Historical Provider, MD  nitrofurantoin (MACRODANTIN) 100 MG capsule Take 100 mg by mouth at bedtime.  05/30/10  Yes Historical Provider, MD   Physical Exam: Filed Vitals:   02/01/13 1753 02/02/13 0027  BP: 149/53 185/83  Pulse: 70 76  Temp: 97.6 F (36.4 C)   TempSrc: Oral   Resp: 20 20  SpO2: 96%      General:  Well-developed and poorly nourished.  Eyes: Anicteric no pallor.  ENT: No discharge from ears eyes nose mouth.  Neck: No mass felt.  Cardiovascular: S1-S2 heard.  Respiratory: No rhonchi or crepitations.  Abdomen: Soft nontender bowel sounds present.  Skin: No rash.  Musculoskeletal: No edema.  Psychiatric: Patient appears confused.  Neurologic: Alert awake oriented to her name. Follows commands.  Labs on Admission:  Basic Metabolic Panel:  Recent Labs Lab 02/01/13 1928  NA 143  K 4.1  CL 109  CO2 22  GLUCOSE 112*  BUN 31*  CREATININE 1.21*  CALCIUM 9.7   Liver Function Tests:  Recent Labs Lab 02/01/13 1928  AST 18  ALT 6  ALKPHOS 93  BILITOT 0.4  PROT 6.4  ALBUMIN 4.0   No results found for this basename: LIPASE, AMYLASE,  in the last 168 hours No results found for this basename: AMMONIA,  in the last 168 hours CBC:  Recent Labs Lab 02/01/13 1928  WBC 7.6  HGB 10.7*  HCT 30.5*  MCV 95.3  PLT 128*   Cardiac Enzymes: No results found for this basename: CKTOTAL, CKMB, CKMBINDEX, TROPONINI,  in the last 168 hours  BNP (last 3 results) No results found for this basename: PROBNP,  in the last 8760 hours CBG:  Recent Labs Lab 02/01/13 2118  GLUCAP 104*    Radiological Exams on Admission: Ct Head Wo Contrast  02/01/2013   *RADIOLOGY REPORT*  Clinical Data: Mental status.  Hallucinations.  CT HEAD WITHOUT CONTRAST  Technique:   Contiguous axial images were obtained from the base of the skull through the vertex without contrast.  Comparison: No priors.  Findings: There are patchy multifocal areas of decreased attenuation throughout the deep and periventricular white matter of the cerebral hemispheres bilaterally, favored to reflect chronic microvascular ischemic disease. No acute intracranial abnormalities.  Specifically, no evidence of acute intracranial hemorrhage, no definite findings of acute/subacute cerebral ischemia, no mass, mass effect, hydrocephalus or abnormal intra or extra-axial fluid collections.  Visualized paranasal sinuses and mastoids are well pneumatized.  No acute displaced skull fractures  are identified.  IMPRESSION: 1.  No acute intracranial abnormalities. 2.  Chronic ischemic changes throughout the deep and periventricular white matter of the cerebral hemispheres bilaterally.   Original Report Authenticated By: Trudie Reed, M.D.     Assessment/Plan Principal Problem:   Dehydration Active Problems:   Hallucination   1. Mild dehydration probably from poor oral intake - gently hydrate. Recheck metabolic panel in a.m. 2. Hallucination with history of dementia - check patient's home medications. Patient's husband is being them in a.m. Presently continue present medication along with when necessary Ativan. Avoid Haldol due to history of Parkinson's. May need psychiatric consult. 3. Parkinson's disease - continue present medications. 4. Hypertension - due to mildly increased creatinine we will hold off Lasix and ARB for now and patient has been placed on when necessary IV hydralazine for systolic blood pressure more than 160. 5. On chronic suppressive therapy for UTI.  Patient probably will need placement for which I have consulted physical therapy and social worker.  Code Status: DO NOT RESUSCITATE.  Family Communication: Patient's husband at the bedside.  Disposition Plan: Admit to inpatient.     KAKRAKANDY,ARSHAD N. [facility] Pager [phone].  If 7PM-7AM, please contact night-coverage [email or web address] Password [facility] 02/02/2013, 12:34 AM

## 2013-02-02 NOTE — Progress Notes (Signed)
Pt was seen and examined and family (husband) was updated at bedside.  The H&P per Dr. Toniann Fail was reviewed and orders updated.   Maryln Manuel, MD pager [phone]

## 2013-02-03 ENCOUNTER — Telehealth: Payer: Self-pay | Admitting: Neurology

## 2013-02-03 LAB — COMPREHENSIVE METABOLIC PANEL
AST: 20 U/L (ref 0–37)
CO2: 21 mEq/L (ref 19–32)
Chloride: 113 mEq/L — ABNORMAL HIGH (ref 96–112)
Creatinine, Ser: 0.9 mg/dL (ref 0.50–1.10)
GFR calc Af Amer: 76 mL/min — ABNORMAL LOW (ref >90)
GFR calc non Af Amer: 66 mL/min — ABNORMAL LOW (ref >90)
Glucose, Bld: 106 mg/dL — ABNORMAL HIGH (ref 70–99)
Total Bilirubin: 0.5 mg/dL (ref 0.3–1.2)

## 2013-02-03 LAB — CBC WITH DIFFERENTIAL/PLATELET
Basophils Absolute: 0 10*3/uL (ref 0.0–0.1)
HCT: 28.7 % — ABNORMAL LOW (ref 36.0–46.0)
Hemoglobin: 10.3 g/dL — ABNORMAL LOW (ref 12.0–15.0)
Lymphocytes Relative: 21 % (ref 12–46)
Lymphs Abs: 1.4 10*3/uL (ref 0.7–4.0)
Monocytes Absolute: 0.7 10*3/uL (ref 0.1–1.0)
Monocytes Relative: 11 % (ref 3–12)
Neutro Abs: 4.4 10*3/uL (ref 1.7–7.7)
RBC: 3.01 MIL/uL — ABNORMAL LOW (ref 3.87–5.11)
RDW: 12 % (ref 11.5–15.5)
WBC: 6.7 10*3/uL (ref 4.0–10.5)

## 2013-02-03 MED ORDER — ENTACAPONE 200 MG PO TABS
100.0000 mg | ORAL_TABLET | ORAL | Status: DC
  Administered 2013-02-03 (×2): 100 mg via ORAL
  Filled 2013-02-03 (×4): qty 0.5

## 2013-02-03 MED ORDER — CARBIDOPA-LEVODOPA 25-100 MG PO TABS
1.0000 | ORAL_TABLET | Freq: Every day | ORAL | Status: DC
  Filled 2013-02-03: qty 1

## 2013-02-03 MED ORDER — ARIPIPRAZOLE 5 MG PO TABS
5.0000 mg | ORAL_TABLET | Freq: Once | ORAL | Status: AC
  Administered 2013-02-03: 5 mg via ORAL
  Filled 2013-02-03: qty 1

## 2013-02-03 MED ORDER — ENTACAPONE 200 MG PO TABS
100.0000 mg | ORAL_TABLET | Freq: Every day | ORAL | Status: DC
  Filled 2013-02-03: qty 0.5

## 2013-02-03 MED ORDER — HYDRALAZINE HCL 20 MG/ML IJ SOLN
10.0000 mg | INTRAMUSCULAR | Status: DC | PRN
  Administered 2013-02-03 – 2013-02-08 (×5): 10 mg via INTRAVENOUS
  Filled 2013-02-03 (×5): qty 1

## 2013-02-03 MED ORDER — LABETALOL HCL 5 MG/ML IV SOLN
10.0000 mg | INTRAVENOUS | Status: DC | PRN
  Administered 2013-02-03 (×2): 10 mg via INTRAVENOUS
  Filled 2013-02-03 (×3): qty 4

## 2013-02-03 MED ORDER — CARBIDOPA-LEVODOPA 25-100 MG PO TABS
1.0000 | ORAL_TABLET | Freq: Every day | ORAL | Status: DC
  Administered 2013-02-04: 07:00:00 1 via ORAL
  Filled 2013-02-03 (×2): qty 1

## 2013-02-03 MED ORDER — CARBIDOPA-LEVODOPA 25-100 MG PO TABS
0.5000 | ORAL_TABLET | ORAL | Status: DC
  Administered 2013-02-03 (×2): 0.5 via ORAL
  Filled 2013-02-03 (×4): qty 0.5

## 2013-02-03 MED ORDER — ENTACAPONE 200 MG PO TABS
200.0000 mg | ORAL_TABLET | Freq: Every day | ORAL | Status: DC
  Administered 2013-02-04: 200 mg via ORAL
  Filled 2013-02-03 (×2): qty 1

## 2013-02-03 NOTE — Progress Notes (Signed)
Patient blood pressure 213/81 at 11:35.  Dr. Vanessa Barbara notified.  10mg  IV labetolol given at 12:23.   Unable to obtain blood pressure at 1300 due to excessive patient movement.  At 1330 patient blood pressure 221/95.  10mg  Hydralazine given and 10mg  Labetolol given per Dr. Vanessa Barbara orders.  1530 Patient BP 164/74.  Patient resting comfortably, will continue to monitor.

## 2013-02-03 NOTE — Progress Notes (Signed)
[facility] PROGRESS NOTE  [name]:[id number] DOB: 1946/05/16 DOA: 02/01/2013 PCP: Aida Puffer, MD  Assessment/Plan:  History of Parkinson's dementia with behavioral changes I suspect in part hallucinations and psychosis may reflect side affect of Stalevo therapy. I discussed case with her Neurologist Dr Anne Hahn who reported patient having a history of taking greater than the recommended doses of stalevo in the past which has resulted in severe psychosis. Psychiatry has been consulted, recommending Risperdal 0.5 mg twice a day. Dr Anne Hahn suggested changing to abilify as Risperdal did not work for her in the past.   Parkinson's disease Dr Anne Hahn recommended cutting stalevo dose back to a full tablet in am with half a tab at lunch and half a tablet in evening, given active hallucinations. Will monitor closely for severe rigidity.   Dehydration Likely secondary to minimal po intake, continue hydration  Hypertension Likely secondary to patient not having her morning meds. Will start prn labetolol and well as encourage her to take meds.   History of Recurrent Urinary Tract Infections Continue macrobid.     Code Status: DNR Family Communication: I discussed plan with her husband who was present at bedside  Disposition Plan: SW consult for placement.    Consultants:  Psychiatry  Neurology; Dr Anne Hahn over telephone conversation  Antibiotics:  Nitrofurantoin  HPI/Subjective: [name] is a pleasant 67 year old female with a past medical history of advanced dementia, Parkinson's disease, who presented to the emergent apartment on 02/02/2013 with increasing hallucinations and agitation, as family members were unable to care for her at home. She was seen by psychiatry yesterday, recommended starting Risperdal 0.5 mg by mouth twice a day. Patient having agitation overnight, appears somewhat improved on my encounter this morning. Her husband is present at bedside. She did take  her morning dose of Risperdal.  Objective: Filed Vitals:   02/03/13 0500  BP: 129/65  Pulse: 61  Temp: 97.6 F (36.4 C)  Resp: 20    Intake/Output Summary (Last 24 hours) at 02/03/13 1143 Last data filed at 02/02/13 1828  Gross per 24 hour  Intake    222 ml  Output      0 ml  Net    222 ml   Filed Weights   02/02/13 0059  Weight: 43.092 kg (95 lb)    Exam:   General:  Mildly agitated  Cardiovascular: Regular rate and rhythm, normal s1 s2  Respiratory: Clear to aucultation, normal respiratory effort  Abdomen: Soft, nontender, nondistended  Musculoskeletal: Muscle atrophy noted  Neurological: She does have bilateral rigidity, cogwheeling.    Data Reviewed: Basic Metabolic Panel:  Recent Labs Lab 02/01/13 1928 02/03/13 0443  NA 143 144  K 4.1 4.0  CL 109 113*  CO2 22 21  GLUCOSE 112* 106*  BUN 31* 16  CREATININE 1.21* 0.90  CALCIUM 9.7 9.4   Liver Function Tests:  Recent Labs Lab 02/01/13 1928 02/03/13 0443  AST 18 20  ALT 6 10  ALKPHOS 93 81  BILITOT 0.4 0.5  PROT 6.4 5.5*  ALBUMIN 4.0 3.4*   No results found for this basename: LIPASE, AMYLASE,  in the last 168 hours No results found for this basename: AMMONIA,  in the last 168 hours CBC:  Recent Labs Lab 02/01/13 1928 02/03/13 0443  WBC 7.6 6.7  NEUTROABS  --  4.4  HGB 10.7* 10.3*  HCT 30.5* 28.7*  MCV 95.3 95.3  PLT 128* 112*   Cardiac Enzymes: No results found for this basename: CKTOTAL, CKMB, CKMBINDEX,  TROPONINI,  in the last 168 hours BNP (last 3 results) No results found for this basename: PROBNP,  in the last 8760 hours CBG:  Recent Labs Lab 02/01/13 2118  GLUCAP 104*    Recent Results (from the past 240 hour(s))  URINE CULTURE     Status: None   Collection Time    02/01/13 10:00 PM      Result Value Range Status   Specimen Description URINE, CLEAN CATCH   Final   Special Requests Normal   Final   Culture  Setup Time     Final   Value: 02/02/2013 07:13      Performed at [facility] Count     Final   Value: NO GROWTH     Performed at [facility]   Culture     Final   Value: NO GROWTH     Performed at [facility]   Report Status 02/02/2013 FINAL   Final     Studies: Ct Head Wo Contrast  02/01/2013   *RADIOLOGY REPORT*  Clinical Data: Mental status.  Hallucinations.  CT HEAD WITHOUT CONTRAST  Technique:  Contiguous axial images were obtained from the base of the skull through the vertex without contrast.  Comparison: No priors.  Findings: There are patchy multifocal areas of decreased attenuation throughout the deep and periventricular white matter of the cerebral hemispheres bilaterally, favored to reflect chronic microvascular ischemic disease. No acute intracranial abnormalities.  Specifically, no evidence of acute intracranial hemorrhage, no definite findings of acute/subacute cerebral ischemia, no mass, mass effect, hydrocephalus or abnormal intra or extra-axial fluid collections.  Visualized paranasal sinuses and mastoids are well pneumatized.  No acute displaced skull fractures are identified.  IMPRESSION: 1.  No acute intracranial abnormalities. 2.  Chronic ischemic changes throughout the deep and periventricular white matter of the cerebral hemispheres bilaterally.   Original Report Authenticated By: Trudie Reed, M.D.    Scheduled Meds: . amantadine  100 mg Oral BID  . carbidopa-levodopa  1 tablet Oral TID WC   And  . entacapone  200 mg Oral TID WC  . enoxaparin (LOVENOX) injection  30 mg Subcutaneous Daily  . metoprolol tartrate  25 mg Oral BID  . mirtazapine  15 mg Oral QHS  . nitrofurantoin  100 mg Oral QHS  . risperiDONE  0.5 mg Oral BID   Continuous Infusions:   Principal Problem:   Dehydration Active Problems:   Hallucination    Time spent: 40    Jeralyn Bennett  [facility] Pager [phone]. If 7PM-7AM, please contact night-coverage at [email or web address], password  [facility] 02/03/2013, 11:43 AM  LOS: 2 days

## 2013-02-03 NOTE — Clinical Social Work Placement (Addendum)
Clinical Social Work Department CLINICAL SOCIAL WORK PLACEMENT NOTE 02/03/2013  Patient:  [name], [name]  Account Number:  [id number] Admit date:  02/01/2013  Clinical Social Worker:  Genelle Bal, LCSW  Date/time:  02/03/2013 11:20 AM  Clinical Social Work is seeking post-discharge placement for this patient at the following level of care:   SKILLED NURSING   (*CSW will update this form in Epic as items are completed)     Patient/family provided with [facility] Department of Clinical Social Work's list of facilities offering this level of care within the geographic area requested by the patient (or if unable, by the patient's family).  02/03/2013  Patient/family informed of their freedom to choose among providers that offer the needed level of care, that participate in Medicare, Medicaid or managed care program needed by the patient, have an available bed and are willing to accept the patient.    Patient/family informed of [facility]' ownership interest in [facility], as well as of the fact that they are under no obligation to receive care at this facility.  PASARR submitted to EDS on 02/03/2013 PASARR number received from EDS on   FL2 transmitted to all facilities in geographic area requested by pt/family on  02/03/2013 FL2 transmitted to all facilities within larger geographic area on 02/03/13 - [place]  Patient informed that his/her managed care company has contracts with or will negotiate with  certain facilities, including the following:     Patient/family informed of bed offers received: 02/04/13  Patient chooses bed at [facility] Physician recommends and patient chooses bed at    Patient to be discharged home on 02/08/13   Patient to be transferred to facility by   The following physician request were entered in Epic:   Additional Comments: 02/03/13 - CSW talked with husband at the bedside regarding SNF placement. Mr. Skeens clarified that  they live in [place] [facility] but was fine with searching for a SNF in  and [place]. 02/05/13 - CSW advised by MD during morning progression that patient not ready for d/c today. Still adjusting medications. Husband was at the bedside and is still very concerned about her medication administration. Admissions director at [facility] aware that their facility chosen & was advised that patient not ready for d/c 9/12, however did not give definitive d/c date.

## 2013-02-03 NOTE — Telephone Encounter (Signed)
The patient has apparently been admitted to the hospital with confusion and hallucinations. The patient has been on the Stalevo taking the 125 mg tablets, one tablet 3 times daily. The patient is also on amantadine. The patient was on Seroquel, but it is not clear that this was helpful for her. The patient did worse with her Parkinson's on Risperdal, and the Abilify could not be afforded. The patient will go back on Abilify, and the Stalevo will be cut back to 1 full tablet in the morning, half tablet at noon and in the evening. The patient may require an extended care facility as the husband can no longer take care of her adequately at home. In the past, the patient has not been completely compliant with the medications.

## 2013-02-04 DIAGNOSIS — E43 Unspecified severe protein-calorie malnutrition: Secondary | ICD-10-CM | POA: Insufficient documentation

## 2013-02-04 LAB — CBC
Hemoglobin: 10.8 g/dL — ABNORMAL LOW (ref 12.0–15.0)
MCHC: 35 g/dL (ref 30.0–36.0)
RDW: 12.2 % (ref 11.5–15.5)

## 2013-02-04 LAB — BASIC METABOLIC PANEL
BUN: 16 mg/dL (ref 6–23)
Creatinine, Ser: 0.85 mg/dL (ref 0.50–1.10)
GFR calc Af Amer: 82 mL/min — ABNORMAL LOW (ref >90)
GFR calc non Af Amer: 70 mL/min — ABNORMAL LOW (ref >90)
Potassium: 3.5 mEq/L (ref 3.5–5.1)

## 2013-02-04 MED ORDER — ENTACAPONE 200 MG PO TABS
200.0000 mg | ORAL_TABLET | ORAL | Status: DC
  Administered 2013-02-04 – 2013-02-08 (×11): 200 mg via ORAL
  Filled 2013-02-04 (×16): qty 1

## 2013-02-04 MED ORDER — ENSURE COMPLETE PO LIQD
237.0000 mL | Freq: Two times a day (BID) | ORAL | Status: DC
  Administered 2013-02-04 – 2013-02-08 (×8): 237 mL via ORAL
  Filled 2013-02-04: qty 237

## 2013-02-04 MED ORDER — CARBIDOPA-LEVODOPA 25-100 MG PO TABS
1.0000 | ORAL_TABLET | Freq: Every day | ORAL | Status: DC
  Administered 2013-02-05 – 2013-02-08 (×4): 1 via ORAL
  Filled 2013-02-04 (×5): qty 1

## 2013-02-04 MED ORDER — ENOXAPARIN SODIUM 40 MG/0.4ML ~~LOC~~ SOLN
40.0000 mg | SUBCUTANEOUS | Status: DC
  Administered 2013-02-05 – 2013-02-08 (×4): 40 mg via SUBCUTANEOUS
  Filled 2013-02-04 (×4): qty 0.4

## 2013-02-04 MED ORDER — CARBIDOPA-LEVODOPA 25-100 MG PO TABS
0.5000 | ORAL_TABLET | ORAL | Status: DC
  Administered 2013-02-04 – 2013-02-05 (×4): 0.5 via ORAL
  Administered 2013-02-06: 13:00:00 via ORAL
  Administered 2013-02-06: 0.5 via ORAL
  Administered 2013-02-07 (×2): via ORAL
  Administered 2013-02-08: 0.5 via ORAL
  Filled 2013-02-04 (×10): qty 0.5

## 2013-02-04 MED ORDER — AMLODIPINE BESYLATE 10 MG PO TABS
10.0000 mg | ORAL_TABLET | Freq: Every day | ORAL | Status: DC
  Administered 2013-02-04 – 2013-02-08 (×5): 10 mg via ORAL
  Filled 2013-02-04 (×6): qty 1

## 2013-02-04 NOTE — Progress Notes (Signed)
Physical Therapy Treatment Patient Details Name: [name] MRN: [id number] DOB: Dec 04, 1945 Today's Date: 02/04/2013 Time: 4098-1191 PT Time Calculation (min): 17 min  PT Assessment / Plan / Recommendation  History of Present Illness 67 y.o. female history of Parkinson's and dementia, hypertension was brought to the ER because patient was found to have increasing hallucination. Patient also was found to have decreased oral intake. As per the husband patient's neurologist Dr. Anne Hahn had recommended Seroquel 3 weeks ago which did not make much difference and was started on olanzapine by patient's PCP as per the husband. Patient's husband is planning to bring her medications in the morning. In addition patient is found to have poor appetite with labs showing mild dehydration. Patient's husband also also found to have increasing difficulty managing patient in the house. Patient otherwise did not have any chest pain shortness of breath abdominal pain nausea vomiting or diarrhea.   PT Comments   Pt more awake/alert this session and able to ambulate in hallway with spouse following with recliner.  Follow Up Recommendations  Supervision/Assistance - 24 hour;SNF     Does the patient have the potential to tolerate intense rehabilitation     Barriers to Discharge        Equipment Recommendations  None recommended by PT    Recommendations for Other Services    Frequency Min 2X/week   Progress towards PT Goals Progress towards PT goals: Progressing toward goals  Plan Current plan remains appropriate;Frequency needs to be updated    Precautions / Restrictions Precautions Precautions: Fall   Pertinent Vitals/Pain None reported    Mobility  Bed Mobility Bed Mobility: Not assessed Details for Bed Mobility Assistance: pt and spouse sitting EOB on arrival Transfers Transfers: Sit to Stand;Stand to Sit Sit to Stand: 4: Min assist;From bed;From toilet Stand to Sit: 4: Min assist;To  chair/3-in-1;To toilet Details for Transfer Assistance: assist to rise and control descent Ambulation/Gait Ambulation/Gait Assistance: 4: Min assist Ambulation Distance (Feet): 90 Feet Assistive device: Rolling walker Ambulation/Gait Assistance Details: spouse reports pt doesn't usually use RW so pt given verbal cues and manual assist for negotiating RW (spouse preferred use of RW to HHA today)  verbal cues to "not cross legs" as pt presents with increased scissoring with fatigue and spouse reports this is cause of most of her falls. Gait Pattern: Step-through pattern;Shuffle;Decreased stride length;Narrow base of support;Scissoring    Exercises     PT Diagnosis:    PT Problem List:   PT Treatment Interventions:     PT Goals (current goals can now be found in the care plan section)    Visit Information  Last PT Received On: 02/04/13 Assistance Needed: +1 History of Present Illness: 67 y.o. female history of Parkinson's and dementia, hypertension was brought to the ER because patient was found to have increasing hallucination. Patient also was found to have decreased oral intake. As per the husband patient's neurologist Dr. Anne Hahn had recommended Seroquel 3 weeks ago which did not make much difference and was started on olanzapine by patient's PCP as per the husband. Patient's husband is planning to bring her medications in the morning. In addition patient is found to have poor appetite with labs showing mild dehydration. Patient's husband also also found to have increasing difficulty managing patient in the house. Patient otherwise did not have any chest pain shortness of breath abdominal pain nausea vomiting or diarrhea.    Subjective Data      Cognition  Cognition Arousal/Alertness: Awake/alert Behavior During  Therapy: Flat affect Overall Cognitive Status: History of cognitive impairments - at baseline Area of Impairment: Safety/judgement Safety/Judgement: Decreased awareness of  safety    Balance     End of Session PT - End of Session Equipment Utilized During Treatment: Gait belt Activity Tolerance: Patient tolerated treatment well Patient left: with call bell/phone within reach;in chair;with family/visitor present   GP     Jameila Keeny,KATHrine E 02/04/2013, 2:52 PM Zenovia Jarred, PT, DPT 02/04/2013 Pager: [phone]

## 2013-02-04 NOTE — ED Provider Notes (Signed)
Medical screening examination/treatment/procedure(s) were performed by non-physician practitioner and as supervising physician I was immediately available for consultation/collaboration.  Layla Maw Ward, DO 02/04/13 [phone]

## 2013-02-04 NOTE — Progress Notes (Addendum)
Pharmacy Note re: Stalevo  Stalevo tablets cannot be cut or crushed.    Pt taking Stalevo 125 tabs PTA (carbidopa 31.25mg , levodopa 125 mg, entacapone 200 mg).    In order to give half of this dose in the afternoon and evening - will use Stalevo 50 tabs (carbidopa 12.5 mg, levodopa 50 mg and entacapone 200 mg).  To summarize, at discharge he will need to have 2 different prescriptions for Stalevo - Stalevo 125 mg tabs to take in AM, and Stalevo 50 mg tabs to take in afternoon and evenings. OR Could separate products into carbidopa/levodopa; and separate script for entacapone, but would still require 2 prescriptions.  FYI - In the hospital, we don't carry Stalevo, so we will use the individual components at equivalent dosing.  Please call if any questions, thanks!  Tad Moore, BCPS  Clinical Pharmacist Pager [phone]  02/04/2013 10:50 AM

## 2013-02-04 NOTE — Progress Notes (Signed)
[facility] PROGRESS NOTE  [name] ZOX:[id number] DOB: 03-Jan-1946 DOA: 02/01/2013 PCP: Aida Puffer, MD  Assessment/Plan:  History of Parkinson's dementia with behavioral changes I suspect in part hallucinations and psychosis may reflect side affect of Stalevo therapy. I discussed case with her Neurologist Dr Anne Hahn who reported patient having a history of taking greater than the recommended doses of stalevo in the past which has resulted in severe psychosis. After decreasing Stalevo dose, psychosis significantly improved. Will continue to follow Dr. Clarisa Kindred recommendations.    Parkinson's disease Dr Anne Hahn recommended cutting stalevo dose back to a full tablet in am with half a tab at lunch and half a tablet in evening, given active hallucinations. Will monitor closely for severe rigidity.   Dehydration Likely secondary to minimal po intake, continue hydration  Hypertension Given clinical improvement, patient not taking her oral antihypertensive agents, blood pressure is better controlled today.Marland Kitchen   History of Recurrent Urinary Tract Infections Continue macrobid.     Code Status: DNR Family Communication: I discussed plan with her husband who was present at bedside  Disposition Plan: SW consult for placement.    Consultants:  Psychiatry  Neurology; Dr Anne Hahn over telephone conversation  Antibiotics:  Nitrofurantoin  HPI/Subjective: Patient appears improved today. This is confirmed with her husband present at bedside. She is less agitated, hallucinating less, not recognizing family members.  Objective: Filed Vitals:   02/04/13 0913  BP: 143/86  Pulse: 54  Temp: 99.1 F (37.3 C)  Resp: 18    Intake/Output Summary (Last 24 hours) at 02/04/13 1310 Last data filed at 02/04/13 8119  Gross per 24 hour  Intake    120 ml  Output      0 ml  Net    120 ml   Filed Weights   02/02/13 0059 02/03/13 2046  Weight: 43.092 kg (95 lb) 92.5 kg (203 lb 14.8 oz)     Exam:   General:  Mildly agitated  Cardiovascular: Regular rate and rhythm, normal s1 s2  Respiratory: Clear to aucultation, normal respiratory effort  Abdomen: Soft, nontender, nondistended  Musculoskeletal: Muscle atrophy noted  Neurological: She does have bilateral rigidity, cogwheeling.    Data Reviewed: Basic Metabolic Panel:  Recent Labs Lab 02/01/13 1928 02/03/13 0443 02/04/13 0525  NA 143 144 143  K 4.1 4.0 3.5  CL 109 113* 110  CO2 22 21 20   GLUCOSE 112* 106* 108*  BUN 31* 16 16  CREATININE 1.21* 0.90 0.85  CALCIUM 9.7 9.4 9.5   Liver Function Tests:  Recent Labs Lab 02/01/13 1928 02/03/13 0443  AST 18 20  ALT 6 10  ALKPHOS 93 81  BILITOT 0.4 0.5  PROT 6.4 5.5*  ALBUMIN 4.0 3.4*   No results found for this basename: LIPASE, AMYLASE,  in the last 168 hours No results found for this basename: AMMONIA,  in the last 168 hours CBC:  Recent Labs Lab 02/01/13 1928 02/03/13 0443 02/04/13 0525  WBC 7.6 6.7 7.5  NEUTROABS  --  4.4  --   HGB 10.7* 10.3* 10.8*  HCT 30.5* 28.7* 30.9*  MCV 95.3 95.3 95.4  PLT 128* 112* 104*   Cardiac Enzymes: No results found for this basename: CKTOTAL, CKMB, CKMBINDEX, TROPONINI,  in the last 168 hours BNP (last 3 results) No results found for this basename: PROBNP,  in the last 8760 hours CBG:  Recent Labs Lab 02/01/13 2118  GLUCAP 104*    Recent Results (from the past 240 hour(s))  URINE CULTURE  Status: None   Collection Time    02/01/13 10:00 PM      Result Value Range Status   Specimen Description URINE, CLEAN CATCH   Final   Special Requests Normal   Final   Culture  Setup Time     Final   Value: 02/02/2013 07:13     Performed at [facility] Count     Final   Value: NO GROWTH     Performed at [facility]   Culture     Final   Value: NO GROWTH     Performed at [facility]   Report Status 02/02/2013 FINAL   Final     Studies: No results  found.  Scheduled Meds: . amantadine  100 mg Oral BID  . [START ON 02/05/2013] carbidopa-levodopa  1 tablet Oral QAC breakfast   And  . carbidopa-levodopa  0.5 tablet Oral Custom   And  . entacapone  200 mg Oral Custom  . enoxaparin (LOVENOX) injection  30 mg Subcutaneous Daily  . metoprolol tartrate  25 mg Oral BID  . mirtazapine  15 mg Oral QHS  . nitrofurantoin  100 mg Oral QHS   Continuous Infusions:   Principal Problem:   Dehydration Active Problems:   Hallucination    Time spent: 35 minutes    Jeralyn Bennett  [facility] Pager [phone]. If 7PM-7AM, please contact night-coverage at [email or web address], password [facility] 02/04/2013, 1:10 PM  LOS: 3 days

## 2013-02-04 NOTE — Progress Notes (Addendum)
INITIAL NUTRITION ASSESSMENT  DOCUMENTATION CODES Per approved criteria  -Severe malnutrition in the context of chronic illness -Underweight   INTERVENTION:  Downgrade diet to Dysphagia 3, thin liquids   Ensure Complete twice daily (350 kcals, 13 gm protein per 8 fl oz bottle) RD to follow for nutrition care plan  NUTRITION DIAGNOSIS: Inadequate oral intake related to poor appetite as evidenced by husband report  Goal: Pt to meet >/= 90% of their estimated nutrition needs   Monitor:  PO & supplemental intake, weight, labs, I/O's  Reason for Assessment: Low Braden  67 y.o. female  Admitting Dx: Dehydration  ASSESSMENT: Patient with PMH of Parkinson's, dementia and hypertension was brought to ER because patient was found to have increasing hallucinations with decreased oral intake.   RD spoke with patient's husband at bedside; reports patient's appetite has been poor and that she's been just taking "bites" of food;per husband patient grazes throughtout the day; PO intake variable at 0-60% per flowsheet records; at risk for skin breakdown; would benefit from addition of supplements ---> RD to order.  Patient meets criteria for severe malnutrition in the context of chronic illness as evidenced by < 75% intake of estimated energy requirement > 1 month, severe muscle loss (clavicels, acromion bone region) and severe subcutaneous fat loss (upper arm region).  Height: Ht Readings from Last 1 Encounters:  02/03/13 5\' 5"  (1.651 m)    Weight: 95 lb (43.1 kg)  Ideal Body Weight: 125 lb  % Ideal Body Weight: 76%  Wt Readings from Last 10 Encounters:  02/03/13 203 lb 14.8 oz (92.5 kg)  12/15/12 100 lb (45.36 kg)  07/22/12 102 lb (46.267 kg)  10/23/11 102 lb (46.267 kg)  10/23/11 102 lb (46.267 kg)  10/17/11 102 lb (46.267 kg)  08/19/11 120 lb (54.432 kg)  08/29/10 115 lb (52.164 kg)  08/21/10 118 lb 6.4 oz (53.706 kg)  02/03/13 reading incorrect  Usual Body Weight: 100  lb  % Usual Body Weight: 95%  BMI:  15.8 kg/m2  Estimated Nutritional Needs: Kcal: 1200-1400 Protein: 55-65 gm Fluid: </= 1.5 L  Skin: Intact  Diet Order: Cardiac  EDUCATION NEEDS: -No education needs identified at this time   Intake/Output Summary (Last 24 hours) at 02/04/13 1517 Last data filed at 02/04/13 1336  Gross per 24 hour  Intake    120 ml  Output      0 ml  Net    120 ml    Labs:   Recent Labs Lab 02/01/13 1928 02/03/13 0443 02/04/13 0525  NA 143 144 143  K 4.1 4.0 3.5  CL 109 113* 110  CO2 22 21 20   BUN 31* 16 16  CREATININE 1.21* 0.90 0.85  CALCIUM 9.7 9.4 9.5  GLUCOSE 112* 106* 108*    CBG (last 3)   Recent Labs  02/01/13 2118  GLUCAP 104*    Scheduled Meds: . amantadine  100 mg Oral BID  . amLODipine  10 mg Oral Daily  . [START ON 02/05/2013] carbidopa-levodopa  1 tablet Oral QAC breakfast   And  . carbidopa-levodopa  0.5 tablet Oral Custom   And  . entacapone  200 mg Oral Custom  . [START ON 02/05/2013] enoxaparin (LOVENOX) injection  40 mg Subcutaneous Q24H  . metoprolol tartrate  25 mg Oral BID  . mirtazapine  15 mg Oral QHS  . nitrofurantoin  100 mg Oral QHS    Continuous Infusions:   Past Medical History  Diagnosis Date  . Spondylolisthesis   .  Lumbar stenosis   . Depression   . Parkinson disease   . Asthma   . Hemangioma     left posterior  . Hypertension   . CVA (cerebrovascular accident)     pts lawyer stold her she had a stroke  . Sleep apnea     does not use a cpap machine  . Blood transfusion 1976  . Chronic UTI   . Hepatitis 1973    hepatitis A ?  Marland Kitchen GERD (gastroesophageal reflux disease)   . Anxiety   . Lumbar radiculopathy     parkinsons  . Memory disorder   . Gait disorder   . Obstructive sleep apnea   . History of seizures   . Gout   . Organic heart disease, NYHA class 1   . History of frequent urinary tract infections   . Chronic back pain   . Hearing deficit     Past Surgical History   Procedure Laterality Date  . Laminectomy    . Cardiac catheterization  2003     Angiographically patent coronary arteries with luminal irregularities in mid left anterior descending coronary artery and mid right coronary  artery. Normal left ventricular systolic function, ejection fraction estimate of  60 to 65% Normal abdominal aorta.   Normal renal arteries.  . Abdominal hysterectomy    . Foot surgery    . Anal fissure surgery    . Appendectomy    . Back surgery    . Lumbar fusion  10/23/2011    Maureen Chatters, RD, LDN Pager #: [phone] After-Hours Pager #: [phone]

## 2013-02-05 MED ORDER — HALOPERIDOL LACTATE 5 MG/ML IJ SOLN
2.5000 mg | Freq: Once | INTRAMUSCULAR | Status: AC
  Administered 2013-02-05: 03:00:00 2.5 mg via INTRAVENOUS
  Filled 2013-02-05: qty 0.5

## 2013-02-05 MED ORDER — HALOPERIDOL LACTATE 5 MG/ML IJ SOLN: 2.5000 mg | Freq: Four times a day (QID) | INTRAMUSCULAR | Status: DC | PRN

## 2013-02-05 MED ORDER — DOCUSATE SODIUM 100 MG PO CAPS
100.0000 mg | ORAL_CAPSULE | Freq: Two times a day (BID) | ORAL | Status: DC
  Administered 2013-02-05 – 2013-02-08 (×5): 100 mg via ORAL
  Filled 2013-02-05 (×9): qty 1

## 2013-02-05 MED ORDER — POLYETHYLENE GLYCOL 3350 17 G PO PACK
17.0000 g | PACK | Freq: Every day | ORAL | Status: DC
  Administered 2013-02-05 – 2013-02-08 (×4): 17 g via ORAL
  Filled 2013-02-05 (×4): qty 1

## 2013-02-05 MED ORDER — SODIUM CHLORIDE 0.9 % IV SOLN
INTRAVENOUS | Status: DC
  Administered 2013-02-05 – 2013-02-07 (×5): via INTRAVENOUS

## 2013-02-05 MED ORDER — AMANTADINE HCL 50 MG/5ML PO SYRP
100.0000 mg | ORAL_SOLUTION | Freq: Two times a day (BID) | ORAL | Status: DC
  Administered 2013-02-06 – 2013-02-08 (×5): 100 mg via ORAL
  Filled 2013-02-05 (×7): qty 10

## 2013-02-05 MED ORDER — DIPHENHYDRAMINE HCL 25 MG PO CAPS
50.0000 mg | ORAL_CAPSULE | Freq: Every evening | ORAL | Status: DC | PRN
  Administered 2013-02-05: 50 mg via ORAL
  Filled 2013-02-05: qty 2

## 2013-02-05 NOTE — Progress Notes (Signed)
Patient is restless and trying to get out of bed. Patient's husband is present in the room and is insisting that he wants the bed alarm of.  He believes the alarm's sound make the patient more agitated. He has been educated on the use and the purpose of the alarm by this RN and he is still insisting on having it off. The bed alarm will be kept off for now.

## 2013-02-05 NOTE — Progress Notes (Signed)
[facility] PROGRESS NOTE  [name] ZOX:[id number] DOB: 06/21/1945 DOA: 02/01/2013 PCP: Aida Puffer, MD   In brief patient is a pleasant 67 year old female with a history of advanced Parkinson's disease, dementia, who was brought to emergency department on 02/01/2013 with complaints of worsening hallucinations, agitation, psychosis. Patient having minimal by mouth intake over the last several days as well. She currently follows Dr. Anne Hahn of neurology. I discussed case with Dr. 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 Dr. Anne Hahn recommended decreasing Stalevo therapy given worsening hallucinations. He recommended giving 1 tablet of Stalevo in a.m., decreasing to half a tablet for her afternoon and evening dose. He also recommended starting Abilify. In the past she had been on risperidone as well as Seroquel which did not work well for her. After enacting these changes changes patient seemed to improve clinically yesterday as she was less agitated, recognized family members and having less hallucinations, however overnight she was agitated and administered given Haldol. Husband reporting to me that she received her evening dose of carbidopa/levadopa at 10 pm which did not work well with her. This morning she was sedated, difficult to arouse. I discussed with pharmacy about getting her back to her home regimen taking her carbidopa/levadopa at 8am, 1pm and 4pm.                                         Code Status: DNR Disposition Plan: Plan for patient to transition to skilled nursing facility in the next 24-48 hours if clinically improved.   Consultants:  Psychiatry   Antibiotics:  Nitrofurantoin   Assessment/Plan:  History of Parkinson's dementia with behavioral changes  I suspect in part hallucinations and psychosis may reflect side affect of Stalevo therapy. I discussed case with her Neurologist Dr Anne Hahn who reported patient having a history of  taking greater than the recommended doses of stalevo in the past which has resulted in severe psychosis. After decreasing Stalevo dose, psychosis significantly improved yesterday, however did get agitated over night. Will get her her back on her home schedule.   Parkinson's disease  Dr Anne Hahn recommended cutting stalevo dose back to a full tablet in am with half a tab at lunch and half a tablet in evening, given active hallucinations. Will monitor closely for severe rigidity.   Dehydration  Likely secondary to minimal po intake, continue hydration   Hypertension  Given clinical improvement, patient not taking her oral antihypertensive agents, blood pressure is better controlled today.Marland Kitchen   History of Recurrent Urinary Tract Infections  Continue macrobid.   Disposition Plan for transfer to SNF once symptoms are controlled..    Objective: Filed Vitals:   02/05/13 1127  BP:   Pulse: 60  Temp:   Resp:     Intake/Output Summary (Last 24 hours) at 02/05/13 1157 Last data filed at 02/04/13 1850  Gross per 24 hour  Intake      0 ml  Output      0 ml  Net      0 ml   Filed Weights   02/02/13 0059 02/03/13 2046 02/04/13 2306  Weight: 43.092 kg (95 lb) 92.5 kg (203 lb 14.8 oz) 95.7 kg (210 lb 15.7 oz)    Exam:   General: Patient is sedated however arousable    Cardiovascular: Regular rate and rhythm normal S1-S2  Respiratory: Lungs overall clear to auscultation bilaterally no  wheezing rhonchi while  Abdomen: Abdomen soft nontender abdomen positive bowel sounds  Musculoskeletal: Present range of motion to all extremities.   Neurologic: Patient having bilateral rigidity, unchanged since yesterday's exam  Data Reviewed: Basic Metabolic Panel:  Recent Labs Lab 02/01/13 1928 02/03/13 0443 02/04/13 0525  NA 143 144 143  K 4.1 4.0 3.5  CL 109 113* 110  CO2 22 21 20   GLUCOSE 112* 106* 108*  BUN 31* 16 16  CREATININE 1.21* 0.90 0.85  CALCIUM 9.7 9.4 9.5   Liver  Function Tests:  Recent Labs Lab 02/01/13 1928 02/03/13 0443  AST 18 20  ALT 6 10  ALKPHOS 93 81  BILITOT 0.4 0.5  PROT 6.4 5.5*  ALBUMIN 4.0 3.4*   No results found for this basename: LIPASE, AMYLASE,  in the last 168 hours No results found for this basename: AMMONIA,  in the last 168 hours CBC:  Recent Labs Lab 02/01/13 1928 02/03/13 0443 02/04/13 0525  WBC 7.6 6.7 7.5  NEUTROABS  --  4.4  --   HGB 10.7* 10.3* 10.8*  HCT 30.5* 28.7* 30.9*  MCV 95.3 95.3 95.4  PLT 128* 112* 104*   Cardiac Enzymes: No results found for this basename: CKTOTAL, CKMB, CKMBINDEX, TROPONINI,  in the last 168 hours BNP (last 3 results) No results found for this basename: PROBNP,  in the last 8760 hours CBG:  Recent Labs Lab 02/01/13 2118  GLUCAP 104*    Recent Results (from the past 240 hour(s))  URINE CULTURE     Status: None   Collection Time    02/01/13 10:00 PM      Result Value Range Status   Specimen Description URINE, CLEAN CATCH   Final   Special Requests Normal   Final   Culture  Setup Time     Final   Value: 02/02/2013 07:13     Performed at [facility] Count     Final   Value: NO GROWTH     Performed at [facility]   Culture     Final   Value: NO GROWTH     Performed at [facility]   Report Status 02/02/2013 FINAL   Final     Studies: No results found.  Scheduled Meds: . amantadine  100 mg Oral BID  . amLODipine  10 mg Oral Daily  . carbidopa-levodopa  1 tablet Oral QAC breakfast   And  . carbidopa-levodopa  0.5 tablet Oral Custom   And  . entacapone  200 mg Oral Custom  . docusate sodium  100 mg Oral BID  . enoxaparin (LOVENOX) injection  40 mg Subcutaneous Q24H  . feeding supplement  237 mL Oral BID BM  . metoprolol tartrate  25 mg Oral BID  . mirtazapine  15 mg Oral QHS  . nitrofurantoin  100 mg Oral QHS  . polyethylene glycol  17 g Oral Daily   Continuous Infusions:   Principal Problem:   Dehydration Active  Problems:   Hallucination   Protein-calorie malnutrition, severe    Time spent: 35 minutes    Jeralyn Bennett  [facility] Pager [phone]. If 7PM-7AM, please contact night-coverage at [email or web address], password [facility] 02/05/2013, 11:57 AM  LOS: 4 days

## 2013-02-06 DIAGNOSIS — I10 Essential (primary) hypertension: Secondary | ICD-10-CM

## 2013-02-06 DIAGNOSIS — R4182 Altered mental status, unspecified: Secondary | ICD-10-CM

## 2013-02-06 DIAGNOSIS — G20 Parkinson's disease: Secondary | ICD-10-CM

## 2013-02-06 DIAGNOSIS — R413 Other amnesia: Secondary | ICD-10-CM

## 2013-02-06 DIAGNOSIS — F29 Unspecified psychosis not due to a substance or known physiological condition: Principal | ICD-10-CM

## 2013-02-06 LAB — BASIC METABOLIC PANEL
CO2: 20 mEq/L (ref 19–32)
Chloride: 109 mEq/L (ref 96–112)
Sodium: 139 mEq/L (ref 135–145)

## 2013-02-06 LAB — CBC
Platelets: 79 10*3/uL — ABNORMAL LOW (ref 150–400)
RBC: 2.72 MIL/uL — ABNORMAL LOW (ref 3.87–5.11)
WBC: 4.6 10*3/uL (ref 4.0–10.5)

## 2013-02-06 MED ORDER — LORAZEPAM 1 MG PO TABS
1.0000 mg | ORAL_TABLET | Freq: Once | ORAL | Status: AC
  Administered 2013-02-06: 1 mg via ORAL
  Filled 2013-02-06: qty 1

## 2013-02-06 NOTE — Progress Notes (Signed)
[facility] PROGRESS NOTE  [name] NFA:[id number] DOB: Oct 02, 1945 DOA: 02/01/2013 PCP: Chelsey Puffer, MD   In brief patient is a pleasant 67 year old female with a history of Chelsey Parkinson's disease, dementia, who was brought to emergency department on 02/01/2013 with complaints of worsening hallucinations, agitation, psychosis. Patient having minimal by mouth intake over the last several days as well. She currently follows Chelsey. Anne Brown of neurology. I discussed case with Chelsey. 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 Chelsey. Anne Brown recommended decreasing Stalevo therapy given worsening hallucinations. He recommended giving 1 tablet of Stalevo in a.m., decreasing to half a tablet for her afternoon and evening dose. He also recommended starting Abilify. In the past she had been on risperidone as well as Seroquel which did not work well for her. After enacting these changes changes patient seemed to improve clinically yesterday as she was less agitated, recognized family members and having less hallucinations, however overnight she was agitated and administered given Haldol. Husband reporting to me that she received her evening dose of carbidopa/levadopa at 10 pm which did not work well with her. This morning she was sedated, difficult to arouse. I discussed with pharmacy about getting her back to her home regimen taking her carbidopa/levadopa at 8am, 1pm and 4pm.                                         Code Status: DNR Disposition Plan: Plan for patient to transition to skilled nursing facility in the next 24-48 hours if clinically improved.   Consultants:  Psychiatry   Antibiotics:  Nitrofurantoin   Assessment/Plan:  History of Parkinson's dementia with behavioral changes  Presumed hallucinations and psychosis may reflect side affect of Stalevo therapy. Discussed case with her Neurologist [name] who reported patient having a history of taking  greater than the recommended doses of stalevo in the past which has resulted in severe psychosis. After decreasing Stalevo dose, psychosis significantly improved. Patient became agitated and required ativan and haldol.  Husband reports patient is more sleepy than usual.  As such will decrease ativan dose.  Parkinson's disease  [name] recommended cutting stalevo dose back to a full tablet in am with half a tab at lunch and half a tablet in evening, given active hallucinations. Will monitor closely for severe rigidity.   Dehydration  Likely secondary to minimal po intake, continue hydration   Hypertension  Pt on metoprolol and amlodipine  History of Recurrent Urinary Tract Infections  Continue macrobid.   Disposition Plan for transfer to SNF once symptoms are controlled..    Objective: Filed Vitals:   02/06/13 1800  BP: 155/70  Pulse: 71  Temp: 98 F (36.7 C)  Resp: 18    Intake/Output Summary (Last 24 hours) at 02/06/13 1915 Last data filed at 02/06/13 1822  Gross per 24 hour  Intake 1811.25 ml  Output    550 ml  Net 1261.25 ml   Filed Weights   02/03/13 2046 02/04/13 2306 02/05/13 2043  Weight: 92.5 kg (203 lb 14.8 oz) 95.7 kg (210 lb 15.7 oz) 43.863 kg (96 lb 11.2 oz)    Exam:   General: Patient is sedated however arousable    Cardiovascular: Regular rate and rhythm normal S1-S2  Respiratory: Lungs overall clear to auscultation bilaterally no wheezing no increased work of breathing  Abdomen: ND, NT, soft  Musculoskeletal: no cyanosis or  clubbing Neurologic: Patient having bilateral rigidity  Data Reviewed: Basic Metabolic Panel:  Recent Labs Lab 02/01/13 1928 02/03/13 0443 02/04/13 0525 02/06/13 0656  NA 143 144 143 139  K 4.1 4.0 3.5 3.8  CL 109 113* 110 109  CO2 22 21 20 20   GLUCOSE 112* 106* 108* 93  BUN 31* 16 16 25*  CREATININE 1.21* 0.90 0.85 0.95  CALCIUM 9.7 9.4 9.5 8.5   Liver Function Tests:  Recent Labs Lab 02/01/13 1928  02/03/13 0443  AST 18 20  ALT 6 10  ALKPHOS 93 81  BILITOT 0.4 0.5  PROT 6.4 5.5*  ALBUMIN 4.0 3.4*   No results found for this basename: LIPASE, AMYLASE,  in the last 168 hours No results found for this basename: AMMONIA,  in the last 168 hours CBC:  Recent Labs Lab 02/01/13 1928 02/03/13 0443 02/04/13 0525 02/06/13 0656  WBC 7.6 6.7 7.5 4.6  NEUTROABS  --  4.4  --   --   HGB 10.7* 10.3* 10.8* 9.2*  HCT 30.5* 28.7* 30.9* 26.4*  MCV 95.3 95.3 95.4 97.1  PLT 128* 112* 104* 79*   Cardiac Enzymes: No results found for this basename: CKTOTAL, CKMB, CKMBINDEX, TROPONINI,  in the last 168 hours BNP (last 3 results) No results found for this basename: PROBNP,  in the last 8760 hours CBG:  Recent Labs Lab 02/01/13 2118  GLUCAP 104*    Recent Results (from the past 240 hour(s))  URINE CULTURE     Status: None   Collection Time    02/01/13 10:00 PM      Result Value Range Status   Specimen Description URINE, CLEAN CATCH   Final   Special Requests Normal   Final   Culture  Setup Time     Final   Value: 02/02/2013 07:13     Performed at [name] Count     Final   Value: NO GROWTH     Performed at [name]   Culture     Final   Value: NO GROWTH     Performed at [name]   Report Status 02/02/2013 FINAL   Final     Studies: No results found.  Scheduled Meds: . amantadine  100 mg Oral BID  . amLODipine  10 mg Oral Daily  . carbidopa-levodopa  1 tablet Oral QAC breakfast   And  . carbidopa-levodopa  0.5 tablet Oral Custom   And  . entacapone  200 mg Oral Custom  . docusate sodium  100 mg Oral BID  . enoxaparin (LOVENOX) injection  40 mg Subcutaneous Q24H  . feeding supplement  237 mL Oral BID BM  . metoprolol tartrate  25 mg Oral BID  . mirtazapine  15 mg Oral QHS  . nitrofurantoin  100 mg Oral QHS  . polyethylene glycol  17 g Oral Daily   Continuous Infusions: . sodium chloride 75 mL/hr at 02/06/13 0907     Principal Problem:   Dehydration Active Problems:   Hallucination   Protein-calorie malnutrition, severe    Time spent: 35 minutes    [name]  [facility] Pager [phone]. If 7PM-7AM, please contact night-coverage at [email or web address], password [name] 02/06/2013, 7:15 PM  LOS: 5 days

## 2013-02-07 DIAGNOSIS — N289 Disorder of kidney and ureter, unspecified: Secondary | ICD-10-CM

## 2013-02-07 DIAGNOSIS — R269 Unspecified abnormalities of gait and mobility: Secondary | ICD-10-CM

## 2013-02-07 MED ORDER — LORAZEPAM 1 MG PO TABS
1.0000 mg | ORAL_TABLET | Freq: Every evening | ORAL | Status: DC | PRN
  Administered 2013-02-07: 22:00:00 1 mg via ORAL
  Filled 2013-02-07: qty 1

## 2013-02-07 NOTE — Progress Notes (Signed)
Patient and patient's spouse requesting Ativan to help patient rest tonight.  MD notified.

## 2013-02-07 NOTE — Progress Notes (Signed)
02/07/13 1743 nsg PIV outdated pt is on the list for piv  Restart followed up with IV RN claims they are behind but she's on the list. Per patient she is a hard stick, she has tiny veins and rolls. Noted ecchymosis on previous piv stick. 2 RNs assessed as well.

## 2013-02-07 NOTE — Progress Notes (Signed)
[facility] PROGRESS NOTE  [name] HQI:[id number] DOB: 1946/02/15 DOA: 02/01/2013 PCP: Aida Puffer, MD   In brief patient is a pleasant 67 year old female with a history of advanced Parkinson's disease, dementia, who was brought to emergency department on 02/01/2013 with complaints of worsening hallucinations, agitation, psychosis. Patient having minimal by mouth intake over the last several days as well. She currently follows Dr. Anne Hahn of neurology. I discussed case with Dr. 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 Dr. Anne Hahn recommended decreasing Stalevo therapy given worsening hallucinations. He recommended giving 1 tablet of Stalevo in a.m., decreasing to half a tablet for her afternoon and evening dose. He also recommended starting Abilify. In the past she had been on risperidone as well as Seroquel which did not work well for her. After enacting these changes changes patient seemed to improve clinically yesterday as she was less agitated, recognized family members and having less hallucinations, however overnight she was agitated and administered given Haldol. Husband reporting to me that she received her evening dose of carbidopa/levadopa at 10 pm which did not work well with her. This morning she was sedated, difficult to arouse. I discussed with pharmacy about getting her back to her home regimen taking her carbidopa/levadopa at 8am, 1pm and 4pm.                                         Code Status: DNR Disposition Plan: Plan for patient to transition to skilled nursing facility in the next 24-48 hours if clinically improved.   Consultants:  Psychiatry   Antibiotics:  Nitrofurantoin   Assessment/Plan:  History of Parkinson's dementia with behavioral changes  Presumed hallucinations and psychosis may reflect side affect of Stalevo therapy. Discussed case with her Neurologist Dr Anne Hahn who reported patient having a history of taking  greater than the recommended doses of stalevo in the past which has resulted in severe psychosis. After decreasing Stalevo dose, psychosis significantly improved. Patient became agitated and required ativan and haldol.  Husband reports patient is more sleepy than usual.  Improvement of somnolence after decreasing ativan. - Family would like 2nd opinion from a neurologist as outpatient given continued symptoms despite current therapy recommendations from her current neurologist.  Parkinson's disease  Dr Anne Hahn recommended cutting stalevo dose back to a full tablet in am with half a tab at lunch and half a tablet in evening, given active hallucinations.   Dehydration  Likely secondary to minimal po intake, continue hydration   Hypertension  Pt on metoprolol and amlodipine  History of Recurrent Urinary Tract Infections  Continue macrobid.   Disposition Plan for transfer to SNF within the next 24-48 hours   Objective: Filed Vitals:   02/07/13 1328  BP: 165/78  Pulse: 77  Temp: 97.8 F (36.6 C)  Resp: 18    Intake/Output Summary (Last 24 hours) at 02/07/13 1828 Last data filed at 02/07/13 1742  Gross per 24 hour  Intake 2182.5 ml  Output    655 ml  Net 1527.5 ml   Filed Weights   02/03/13 2046 02/04/13 2306 02/05/13 2043  Weight: 92.5 kg (203 lb 14.8 oz) 95.7 kg (210 lb 15.7 oz) 43.863 kg (96 lb 11.2 oz)    Exam:   General: Patient is awake and alert  Cardiovascular: Regular rate and rhythm normal S1-S2  Respiratory: Lungs overall clear to auscultation bilaterally no wheezing no  increased work of breathing  Abdomen: ND, NT, soft  Musculoskeletal: no cyanosis or clubbing   Neurologic: Patient having bilateral rigidity  Data Reviewed: Basic Metabolic Panel:  Recent Labs Lab 02/01/13 1928 02/03/13 0443 02/04/13 0525 02/06/13 0656  NA 143 144 143 139  K 4.1 4.0 3.5 3.8  CL 109 113* 110 109  CO2 22 21 20 20   GLUCOSE 112* 106* 108* 93  BUN 31* 16 16 25*   CREATININE 1.21* 0.90 0.85 0.95  CALCIUM 9.7 9.4 9.5 8.5   Liver Function Tests:  Recent Labs Lab 02/01/13 1928 02/03/13 0443  AST 18 20  ALT 6 10  ALKPHOS 93 81  BILITOT 0.4 0.5  PROT 6.4 5.5*  ALBUMIN 4.0 3.4*   No results found for this basename: LIPASE, AMYLASE,  in the last 168 hours No results found for this basename: AMMONIA,  in the last 168 hours CBC:  Recent Labs Lab 02/01/13 1928 02/03/13 0443 02/04/13 0525 02/06/13 0656  WBC 7.6 6.7 7.5 4.6  NEUTROABS  --  4.4  --   --   HGB 10.7* 10.3* 10.8* 9.2*  HCT 30.5* 28.7* 30.9* 26.4*  MCV 95.3 95.3 95.4 97.1  PLT 128* 112* 104* 79*   Cardiac Enzymes: No results found for this basename: CKTOTAL, CKMB, CKMBINDEX, TROPONINI,  in the last 168 hours BNP (last 3 results) No results found for this basename: PROBNP,  in the last 8760 hours CBG:  Recent Labs Lab 02/01/13 2118  GLUCAP 104*    Recent Results (from the past 240 hour(s))  URINE CULTURE     Status: None   Collection Time    02/01/13 10:00 PM      Result Value Range Status   Specimen Description URINE, CLEAN CATCH   Final   Special Requests Normal   Final   Culture  Setup Time     Final   Value: 02/02/2013 07:13     Performed at [facility] Count     Final   Value: NO GROWTH     Performed at [facility]   Culture     Final   Value: NO GROWTH     Performed at [facility]   Report Status 02/02/2013 FINAL   Final     Studies: No results found.  Scheduled Meds: . amantadine  100 mg Oral BID  . amLODipine  10 mg Oral Daily  . carbidopa-levodopa  1 tablet Oral QAC breakfast   And  . carbidopa-levodopa  0.5 tablet Oral Custom   And  . entacapone  200 mg Oral Custom  . docusate sodium  100 mg Oral BID  . enoxaparin (LOVENOX) injection  40 mg Subcutaneous Q24H  . feeding supplement  237 mL Oral BID BM  . metoprolol tartrate  25 mg Oral BID  . mirtazapine  15 mg Oral QHS  . nitrofurantoin  100 mg Oral  QHS  . polyethylene glycol  17 g Oral Daily   Continuous Infusions: . sodium chloride 75 mL/hr at 02/07/13 1003    Principal Problem:   Dehydration Active Problems:   Hallucination   Protein-calorie malnutrition, severe    Time spent: 35 minutes    [name]  [facility] Pager [phone]. If 7PM-7AM, please contact night-coverage at [email or web address], password [facility] 02/07/2013, 6:28 PM  LOS: 6 days

## 2013-02-07 NOTE — Progress Notes (Signed)
Patient & patient's spouse asking for Ativan to help patient sleep tonight.  MD notified.

## 2013-02-08 MED ORDER — CARBIDOPA-LEVODOPA-ENTACAPONE 31.25-125-200 MG PO TABS: 1.0000 | ORAL_TABLET | Freq: Three times a day (TID) | ORAL | Status: DC

## 2013-02-08 MED ORDER — AMLODIPINE BESYLATE 10 MG PO TABS: 10.0000 mg | ORAL_TABLET | Freq: Every day | ORAL | Status: DC

## 2013-02-08 NOTE — Care Management Note (Signed)
   CARE MANAGEMENT NOTE 02/08/2013  Patient:  [name], [name]   Account Number:  [id number]  Date Initiated:  02/05/2013  Documentation initiated by:  [facility]  Subjective/Objective Assessment:   renal insufficiency, dehydration     Action/Plan:   02/08/2013 Met with pt and husband who selected [facility] for [facility] services.   Anticipated DC Date:  02/08/2013   Anticipated DC Plan:  HOME W HOME HEALTH SERVICES      DC Planning Services  CM consult      Choice offered to / List presented to:          [facility] arranged  HH-1 RN  HH-2 PT      [facility] agency  [facility]   Status of service:  Completed, signed off Medicare Important Message given?   (If response is "NO", the following Medicare IM given date fields will be blank) Date Medicare IM given:   Date Additional Medicare IM given:    Discharge Disposition:  HOME W HOME HEALTH SERVICES  Per UR Regulation:    If discussed at Long Length of Stay Meetings, dates discussed:    Comments:  02/08/2013 Met with pt and husband , [facility] selected for [facility] services, home phone number to setup [facility] is [phone]. Johny Shock RN MPH, case manager, [phone]

## 2013-02-08 NOTE — Discharge Summary (Signed)
Physician Discharge Summary  Chelsey LAVIOLETTE ZOX:[id number] DOB: 12/03/1945 DOA: 02/01/2013  PCP: Aida Puffer, MD  Admit date: 02/01/2013 Discharge date: 02/08/2013  Time spent: > 35 minutes  Recommendations for Outpatient Follow-up:  1. Please be sure to follow up with your neurologist for further recommendations  Discharge Diagnoses:  Principal Problem:   Dehydration Active Problems:   Hallucination   Protein-calorie malnutrition, severe   Discharge Condition: Stable  Diet recommendation: low sodium heart healthy  Filed Weights   02/04/13 2306 02/05/13 2043 02/07/13 2048  Weight: 95.7 kg (210 lb 15.7 oz) 43.863 kg (96 lb 11.2 oz) 47.5 kg (104 lb 11.5 oz)    History of present illness:  From original HPI: [name] is a 67 y.o. female history of Parkinson's and dementia, hypertension was brought to the ER because patient was found to have increasing hallucination. Patient also was found to have decreased oral intake. As per the husband patient's neurologist Dr. Anne Hahn had recommended Seroquel 3 weeks ago which did not make much difference and was started on olanzapine by patient's PCP as per the husband. Patient's husband is planning to bring her medications in the morning. In addition patient is found to have poor appetite with labs showing mild dehydration. Patient's husband also also found to have increasing difficulty managing patient in the house. Patient otherwise did not have any chest pain shortness of breath abdominal pain nausea vomiting or diarrhea.  Hospital Course:  History of Parkinson's dementia with behavioral changes  Presumed hallucinations and psychosis may reflect side affect of Stalevo therapy. Discussed case with her Neurologist Dr Anne Hahn who reported patient having a history of taking greater than the recommended doses of stalevo in the past which has resulted in severe psychosis. After decreasing Stalevo dose, psychosis significantly improved.   - Discussed  proper recommended dose of Stalevo with patient and spouse - Family requesting discharge to home.  Parkinson's disease  Dr Anne Hahn recommended cutting stalevo dose back to a full tablet in am with half a tab at lunch and half a tablet in evening, given active hallucinations. This improved patient's condition.   Dehydration  Likely secondary to minimal po intake, continue hydration and encouraged increased po intake.  Will continue to hold lasix.  Hypertension  Pt on metoprolol and amlodipine and will d/c on this regimen.    History of Recurrent Urinary Tract Infections  Continue macrobid.    Procedures:  None  Consultations:  None: case discussed with patient's neurologist  Discharge Exam: Filed Vitals:   02/08/13 1038  BP: 160/82  Pulse: 82  Temp:   Resp:     General: Pt in NAD, alert and Awake Cardiovascular: RRR, no MRG Respiratory: CTA BL, no wheezes  Discharge Instructions  Discharge Orders   Future Appointments Provider Department Dept Phone   07/06/2013 2:30 PM York Spaniel, MD [facility] [phone]   Future Orders Complete By Expires   Call MD for:  difficulty breathing, headache or visual disturbances  As directed    Call MD for:  extreme fatigue  As directed    Call MD for:  temperature >100.4  As directed    Diet - low sodium heart healthy  As directed    Discharge instructions  As directed    Comments:     Please be sure to follow up with your neurologist in 1-2 weeks or sooner should any new concerns arise.   Increase activity slowly  As directed  Medication List    STOP taking these medications       furosemide 20 MG tablet  Commonly known as:  LASIX     losartan 100 MG tablet  Commonly known as:  COZAAR      TAKE these medications       ALPRAZolam 1 MG tablet  Commonly known as:  XANAX  Take 0.5-1 mg by mouth 3 (three) times daily as needed for anxiety.     amantadine 100 MG capsule  Commonly known  as:  SYMMETREL  Take 1 capsule (100 mg total) by mouth 2 (two) times daily.     amLODipine 10 MG tablet  Commonly known as:  NORVASC  Take 1 tablet (10 mg total) by mouth daily.     carbidopa-levodopa-entacapone 31.25-125-200 MG per tablet  Commonly known as:  STALEVO  Take 1 tablet by mouth 3 (three) times daily. One full tablet in the morning and 1/2 tablet in the afternoon and evening.     metoprolol tartrate 25 MG tablet  Commonly known as:  LOPRESSOR  Take 25 mg by mouth 2 (two) times daily.     nitrofurantoin 100 MG capsule  Commonly known as:  MACRODANTIN  Take 100 mg by mouth at bedtime.       Allergies  Allergen Reactions  . Estrogens Hives and Swelling  . Tramadol     Unsure of reaction per patient  . Hydromorphone Rash  . Neurontin [Gabapentin] Other (See Comments)    Urinary problems      The results of significant diagnostics from this hospitalization (including imaging, microbiology, ancillary and laboratory) are listed below for reference.    Significant Diagnostic Studies: Ct Head Wo Contrast  02/01/2013   *RADIOLOGY REPORT*  Clinical Data: Mental status.  Hallucinations.  CT HEAD WITHOUT CONTRAST  Technique:  Contiguous axial images were obtained from the base of the skull through the vertex without contrast.  Comparison: No priors.  Findings: There are patchy multifocal areas of decreased attenuation throughout the deep and periventricular white matter of the cerebral hemispheres bilaterally, favored to reflect chronic microvascular ischemic disease. No acute intracranial abnormalities.  Specifically, no evidence of acute intracranial hemorrhage, no definite findings of acute/subacute cerebral ischemia, no mass, mass effect, hydrocephalus or abnormal intra or extra-axial fluid collections.  Visualized paranasal sinuses and mastoids are well pneumatized.  No acute displaced skull fractures are identified.  IMPRESSION: 1.  No acute intracranial abnormalities. 2.   Chronic ischemic changes throughout the deep and periventricular white matter of the cerebral hemispheres bilaterally.   Original Report Authenticated By: Trudie Reed, M.D.    Microbiology: Recent Results (from the past 240 hour(s))  URINE CULTURE     Status: None   Collection Time    02/01/13 10:00 PM      Result Value Range Status   Specimen Description URINE, CLEAN CATCH   Final   Special Requests Normal   Final   Culture  Setup Time     Final   Value: 02/02/2013 07:13     Performed at [facility] Count     Final   Value: NO GROWTH     Performed at [facility]   Culture     Final   Value: NO GROWTH     Performed at [facility]   Report Status 02/02/2013 FINAL   Final     Labs: Basic Metabolic Panel:  Recent Labs Lab 02/01/13 1928 02/03/13 0443 02/04/13 1191  02/06/13 0656  NA 143 144 143 139  K 4.1 4.0 3.5 3.8  CL 109 113* 110 109  CO2 22 21 20 20   GLUCOSE 112* 106* 108* 93  BUN 31* 16 16 25*  CREATININE 1.21* 0.90 0.85 0.95  CALCIUM 9.7 9.4 9.5 8.5   Liver Function Tests:  Recent Labs Lab 02/01/13 1928 02/03/13 0443  AST 18 20  ALT 6 10  ALKPHOS 93 81  BILITOT 0.4 0.5  PROT 6.4 5.5*  ALBUMIN 4.0 3.4*   No results found for this basename: LIPASE, AMYLASE,  in the last 168 hours No results found for this basename: AMMONIA,  in the last 168 hours CBC:  Recent Labs Lab 02/01/13 1928 02/03/13 0443 02/04/13 0525 02/06/13 0656  WBC 7.6 6.7 7.5 4.6  NEUTROABS  --  4.4  --   --   HGB 10.7* 10.3* 10.8* 9.2*  HCT 30.5* 28.7* 30.9* 26.4*  MCV 95.3 95.3 95.4 97.1  PLT 128* 112* 104* 79*   Cardiac Enzymes: No results found for this basename: CKTOTAL, CKMB, CKMBINDEX, TROPONINI,  in the last 168 hours BNP: BNP (last 3 results) No results found for this basename: PROBNP,  in the last 8760 hours CBG:  Recent Labs Lab 02/01/13 2118  GLUCAP 104*       Signed:  [name]  [facility] 02/08/2013, 1:03 PM

## 2013-02-11 ENCOUNTER — Telehealth: Payer: Self-pay | Admitting: Neurology

## 2013-02-16 NOTE — Telephone Encounter (Signed)
I spoke with patient. She reports that she is doing much better now. Patient knows that she has a follow up appointment at our office at noon on Oct. 14, 2014. She will be here 15 minutes early. I reminded her to make sure she remained well hydrated. She stated that, "I am over hydrated, if you know what I mean." I chuckled and said I did. I asked that the patient call back with any questions or concerns.

## 2013-03-08 ENCOUNTER — Encounter (HOSPITAL_COMMUNITY): Payer: Self-pay | Admitting: Emergency Medicine

## 2013-03-08 ENCOUNTER — Inpatient Hospital Stay (HOSPITAL_COMMUNITY)
Admission: EM | Admit: 2013-03-08 | Discharge: 2013-03-16 | DRG: 885 | Disposition: A | Discharge status: Skilled Nursing Facility | Payer: Medicare Other | Attending: Internal Medicine | Admitting: Internal Medicine

## 2013-03-08 ENCOUNTER — Emergency Department (HOSPITAL_COMMUNITY): Payer: Medicare Other

## 2013-03-08 DIAGNOSIS — G20 Parkinson's disease: Secondary | ICD-10-CM | POA: Diagnosis present

## 2013-03-08 DIAGNOSIS — D649 Anemia, unspecified: Secondary | ICD-10-CM | POA: Diagnosis present

## 2013-03-08 DIAGNOSIS — Z01810 Encounter for preprocedural cardiovascular examination: Secondary | ICD-10-CM

## 2013-03-08 DIAGNOSIS — Z87891 Personal history of nicotine dependence: Secondary | ICD-10-CM

## 2013-03-08 DIAGNOSIS — E860 Dehydration: Secondary | ICD-10-CM

## 2013-03-08 DIAGNOSIS — J45909 Unspecified asthma, uncomplicated: Secondary | ICD-10-CM | POA: Diagnosis present

## 2013-03-08 DIAGNOSIS — F411 Generalized anxiety disorder: Secondary | ICD-10-CM | POA: Diagnosis present

## 2013-03-08 DIAGNOSIS — F03918 Unspecified dementia, unspecified severity, with other behavioral disturbance: Secondary | ICD-10-CM

## 2013-03-08 DIAGNOSIS — E43 Unspecified severe protein-calorie malnutrition: Secondary | ICD-10-CM

## 2013-03-08 DIAGNOSIS — F0280 Dementia in other diseases classified elsewhere without behavioral disturbance: Secondary | ICD-10-CM | POA: Diagnosis present

## 2013-03-08 DIAGNOSIS — N390 Urinary tract infection, site not specified: Secondary | ICD-10-CM | POA: Diagnosis present

## 2013-03-08 DIAGNOSIS — I10 Essential (primary) hypertension: Secondary | ICD-10-CM

## 2013-03-08 DIAGNOSIS — F29 Unspecified psychosis not due to a substance or known physiological condition: Principal | ICD-10-CM | POA: Diagnosis present

## 2013-03-08 DIAGNOSIS — M47817 Spondylosis without myelopathy or radiculopathy, lumbosacral region: Secondary | ICD-10-CM

## 2013-03-08 DIAGNOSIS — G20A1 Parkinson's disease without dyskinesia, without mention of fluctuations: Secondary | ICD-10-CM

## 2013-03-08 DIAGNOSIS — G4733 Obstructive sleep apnea (adult) (pediatric): Secondary | ICD-10-CM | POA: Diagnosis present

## 2013-03-08 DIAGNOSIS — Z981 Arthrodesis status: Secondary | ICD-10-CM

## 2013-03-08 DIAGNOSIS — F0391 Unspecified dementia with behavioral disturbance: Secondary | ICD-10-CM | POA: Diagnosis present

## 2013-03-08 DIAGNOSIS — Z515 Encounter for palliative care: Secondary | ICD-10-CM

## 2013-03-08 DIAGNOSIS — R269 Unspecified abnormalities of gait and mobility: Secondary | ICD-10-CM

## 2013-03-08 DIAGNOSIS — M48062 Spinal stenosis, lumbar region with neurogenic claudication: Secondary | ICD-10-CM

## 2013-03-08 DIAGNOSIS — F329 Major depressive disorder, single episode, unspecified: Secondary | ICD-10-CM | POA: Diagnosis present

## 2013-03-08 DIAGNOSIS — F3289 Other specified depressive episodes: Secondary | ICD-10-CM | POA: Diagnosis present

## 2013-03-08 DIAGNOSIS — R443 Hallucinations, unspecified: Secondary | ICD-10-CM

## 2013-03-08 DIAGNOSIS — Z66 Do not resuscitate: Secondary | ICD-10-CM | POA: Diagnosis present

## 2013-03-08 DIAGNOSIS — R413 Other amnesia: Secondary | ICD-10-CM

## 2013-03-08 DIAGNOSIS — Z87440 Personal history of urinary (tract) infections: Secondary | ICD-10-CM

## 2013-03-08 DIAGNOSIS — R410 Disorientation, unspecified: Secondary | ICD-10-CM

## 2013-03-08 DIAGNOSIS — M545 Low back pain, unspecified: Secondary | ICD-10-CM

## 2013-03-08 DIAGNOSIS — F0392 Unspecified dementia, unspecified severity, with psychotic disturbance: Secondary | ICD-10-CM

## 2013-03-08 DIAGNOSIS — F0390 Unspecified dementia without behavioral disturbance: Secondary | ICD-10-CM

## 2013-03-08 DIAGNOSIS — Z8673 Personal history of transient ischemic attack (TIA), and cerebral infarction without residual deficits: Secondary | ICD-10-CM

## 2013-03-08 DIAGNOSIS — K219 Gastro-esophageal reflux disease without esophagitis: Secondary | ICD-10-CM | POA: Diagnosis present

## 2013-03-08 DIAGNOSIS — E119 Type 2 diabetes mellitus without complications: Secondary | ICD-10-CM

## 2013-03-08 LAB — COMPREHENSIVE METABOLIC PANEL
AST: 17 U/L (ref 0–37)
Albumin: 3.8 g/dL (ref 3.5–5.2)
Alkaline Phosphatase: 104 U/L (ref 39–117)
Chloride: 109 mEq/L (ref 96–112)
Potassium: 4.3 mEq/L (ref 3.5–5.1)
Sodium: 144 mEq/L (ref 135–145)
Total Bilirubin: 0.4 mg/dL (ref 0.3–1.2)
Total Protein: 6.5 g/dL (ref 6.0–8.3)

## 2013-03-08 LAB — URINE MICROSCOPIC-ADD ON

## 2013-03-08 LAB — CBC WITH DIFFERENTIAL/PLATELET
Basophils Absolute: 0 10*3/uL (ref 0.0–0.1)
Basophils Relative: 0 % (ref 0–1)
Eosinophils Absolute: 0.2 10*3/uL (ref 0.0–0.7)
Hemoglobin: 10.2 g/dL — ABNORMAL LOW (ref 12.0–15.0)
MCH: 34.7 pg — ABNORMAL HIGH (ref 26.0–34.0)
MCHC: 35.4 g/dL (ref 30.0–36.0)
Neutro Abs: 4.9 10*3/uL (ref 1.7–7.7)
Neutrophils Relative %: 65 % (ref 43–77)
Platelets: 124 10*3/uL — ABNORMAL LOW (ref 150–400)
RDW: 12.8 % (ref 11.5–15.5)

## 2013-03-08 LAB — URINALYSIS, ROUTINE W REFLEX MICROSCOPIC
Glucose, UA: NEGATIVE mg/dL
Hgb urine dipstick: NEGATIVE
Leukocytes, UA: NEGATIVE
Protein, ur: 100 mg/dL — AB
Specific Gravity, Urine: 1.017 (ref 1.005–1.030)
Urobilinogen, UA: 1 mg/dL (ref 0.0–1.0)

## 2013-03-08 LAB — LACTIC ACID, PLASMA: Lactic Acid, Venous: 0.9 mmol/L (ref 0.5–2.2)

## 2013-03-08 MED ORDER — SODIUM CHLORIDE 0.9 % IV SOLN
Freq: Once | INTRAVENOUS | Status: AC
  Administered 2013-03-09: 01:00:00 via INTRAVENOUS

## 2013-03-08 MED ORDER — SODIUM CHLORIDE 0.9 % IV BOLUS (SEPSIS)
1000.0000 mL | Freq: Once | INTRAVENOUS | Status: AC
  Administered 2013-03-08: 1000 mL via INTRAVENOUS

## 2013-03-08 NOTE — ED Provider Notes (Signed)
CSN: [id number]     Arrival date & time 03/08/13  2020 History   First MD Initiated Contact with Patient 03/08/13 2059     Chief Complaint  Patient presents with  . Hallucinations  . Altered Mental Status   (Consider location/radiation/quality/duration/timing/severity/associated sxs/prior Treatment) Patient is a 67 y.o. female presenting with altered mental status. The history is provided by the patient. The history is limited by the condition of the patient (demented and delirious).  Altered Mental Status Presenting symptoms: behavior changes, confusion, disorientation, lethargy and partial responsiveness   Presenting symptoms: no memory loss   Severity:  Severe Most recent episode:  More than 2 days ago Episode history:  Multiple Timing:  Intermittent Progression:  Waxing and waning Chronicity:  Recurrent Context: dementia and recent change in medication   Context: not a recent illness and not a recent infection     Past Medical History  Diagnosis Date  . Spondylolisthesis   . Lumbar stenosis   . Depression   . Parkinson disease   . Asthma   . Hemangioma     left posterior  . Hypertension   . CVA (cerebrovascular accident)     pts lawyer stold her she had a stroke  . Sleep apnea     does not use a cpap machine  . Blood transfusion 1976  . Chronic UTI   . Hepatitis 1973    hepatitis A ?  Marland Kitchen GERD (gastroesophageal reflux disease)   . Anxiety   . Lumbar radiculopathy     parkinsons  . Memory disorder   . Gait disorder   . Obstructive sleep apnea   . History of seizures   . Gout   . Organic heart disease, NYHA class 1   . History of frequent urinary tract infections   . Chronic back pain   . Hearing deficit    Past Surgical History  Procedure Laterality Date  . Laminectomy    . Cardiac catheterization  2003     Angiographically patent coronary arteries with luminal irregularities in mid left anterior descending coronary artery and mid right coronary  artery.  Normal left ventricular systolic function, ejection fraction estimate of  60 to 65% Normal abdominal aorta.   Normal renal arteries.  . Abdominal hysterectomy    . Foot surgery    . Anal fissure surgery    . Appendectomy    . Back surgery    . Lumbar fusion  10/23/2011   Family History  Problem Relation Age of Onset  . Coronary artery disease    . Anesthesia problems Neg Hx   . Cancer Mother   . Kidney cancer Mother   . Heart disease Mother   . Depression Mother   . Heart disease Father   . Brain cancer Sister   . Depression Sister   . Diabetes Brother   . Diabetes Sister   . Depression Other   . Depression Son    History  Substance Use Topics  . Smoking status: Former Games developer  . Smokeless tobacco: Never Used  . Alcohol Use: No   OB History   Grav Para Term Preterm Abortions TAB SAB Ect Mult Living                 Review of Systems  Unable to perform ROS: Mental status change  Psychiatric/Behavioral: Positive for confusion. Negative for memory loss.    Allergies  Estrogens; Tramadol; Hydromorphone; Neurontin; and Olanzapine  Home Medications   Current Outpatient Rx  Name  Route  Sig  Dispense  Refill  . acetaminophen (TYLENOL) 500 MG tablet   Oral   Take 500-1,000 mg by mouth every 6 (six) hours as needed for pain.         Marland Kitchen ALPRAZolam (XANAX) 1 MG tablet   Oral   Take 0.5-1 mg by mouth 3 (three) times daily as needed for anxiety.          Marland Kitchen amantadine (SYMMETREL) 100 MG capsule   Oral   Take 100 mg by mouth 2 (two) times daily.         Marland Kitchen amLODipine (NORVASC) 2.5 MG tablet   Oral   Take 2.5 mg by mouth daily.         . carbidopa-levodopa-entacapone (STALEVO) 31.25-125-200 MG per tablet   Oral   Take 1 tablet by mouth 3 (three) times daily. One full tablet in the morning and 1/2 tablet in the afternoon and evening.         . cloNIDine (CATAPRES) 0.1 MG tablet   Oral   Take 0.1 mg by mouth daily.         . diphenhydrAMINE (BENADRYL) 25 MG  tablet   Oral   Take 25 mg by mouth every 8 (eight) hours as needed for allergies or sleep.         . ferrous sulfate 325 (65 FE) MG EC tablet   Oral   Take 325 mg by mouth 2 (two) times daily.         . meloxicam (MOBIC) 15 MG tablet   Oral   Take 15 mg by mouth daily.         . metoprolol tartrate (LOPRESSOR) 25 MG tablet   Oral   Take 25 mg by mouth 2 (two) times daily.         . nitrofurantoin (MACRODANTIN) 50 MG capsule   Oral   Take 50 mg by mouth at bedtime.         Marland Kitchen omeprazole (PRILOSEC) 20 MG capsule   Oral   Take 20 mg by mouth daily.         . polyethylene glycol (MIRALAX / GLYCOLAX) packet   Oral   Take 17 g by mouth daily.         Marland Kitchen sulfamethoxazole-trimethoprim (BACTRIM DS,SEPTRA DS) 800-160 MG per tablet   Oral   Take 1 tablet by mouth 2 (two) times daily. Started 03/08/13, for 10 days, ending 03/17/13          BP 153/71  Pulse 92  Temp(Src) 98.4 F (36.9 C) (Oral)  Resp 16  SpO2 100% Physical Exam  Nursing note and vitals reviewed. Constitutional: She appears well-developed. She appears listless. No distress.  Female appears much older than stated age. In no apparent distress  HENT:  Head: Normocephalic and atraumatic.  Mouth/Throat: No oropharyngeal exudate.  Mucous membranes dry  Eyes: Conjunctivae and EOM are normal. Pupils are equal, round, and reactive to light.  Neck: Normal range of motion. Neck supple.  Cardiovascular: Normal rate, regular rhythm and normal heart sounds.  Exam reveals no gallop and no friction rub.   No murmur heard. Pulmonary/Chest: Effort normal and breath sounds normal. No respiratory distress. She has no wheezes. She has no rales.  Abdominal: Soft. She exhibits no distension and no mass. There is no tenderness. There is no rebound and no guarding.  Musculoskeletal:  Atraumatic  Lymphadenopathy:    She has no cervical adenopathy.  Neurological: She appears listless.  She is disoriented (Oriented only  to person). She displays tremor. No cranial nerve deficit or sensory deficit. GCS eye subscore is 3. GCS verbal subscore is 4. GCS motor subscore is 6.  States name and birthday. Remainder of exam mumbles incomprehensibly.  Skin: Skin is warm and dry. She is not diaphoretic.    ED Course  Procedures (including critical care time) Labs Review Labs Reviewed  CBC WITH DIFFERENTIAL - Abnormal; Notable for the following:    RBC 2.94 (*)    Hemoglobin 10.2 (*)    HCT 28.8 (*)    MCH 34.7 (*)    Platelets 124 (*)    Monocytes Relative 13 (*)    All other components within normal limits  COMPREHENSIVE METABOLIC PANEL - Abnormal; Notable for the following:    Glucose, Bld 103 (*)    BUN 33 (*)    GFR calc non Af Amer 58 (*)    GFR calc Af Amer 67 (*)    All other components within normal limits  URINALYSIS, ROUTINE W REFLEX MICROSCOPIC - Abnormal; Notable for the following:    Color, Urine AMBER (*)    Protein, ur 100 (*)    All other components within normal limits  URINE MICROSCOPIC-ADD ON - Abnormal; Notable for the following:    Casts HYALINE CASTS (*)    All other components within normal limits  URINE CULTURE  LACTIC ACID, PLASMA  PROTIME-INR  AMMONIA   Imaging Review Dg Chest 2 View  03/08/2013   CLINICAL DATA:  Hallucination and altered mental status.  EXAM: CHEST  2 VIEW  COMPARISON:  10/17/2011  FINDINGS: Limited lateral projection due to patient positioning.  Mild streaky lower lung opacities, likely scarring or atelectasis given morphology. No frank consolidation or edema. No effusion or pneumothorax. Normal heart size.  Diffuse degenerative endplate spurring.  IMPRESSION: Probable mild atelectasis at the bases. No edema or consolidationsuspected .   Electronically Signed   By: Tiburcio Pea M.D.   On: 03/08/2013 23:18   Ct Head Wo Contrast  03/08/2013   CLINICAL DATA:  Hallucinations with loss of consciousness  EXAM: CT HEAD WITHOUT CONTRAST  TECHNIQUE: Contiguous  axial images were obtained from the base of the skull through the vertex without intravenous contrast.  COMPARISON:  02/01/2013  FINDINGS: Motion degraded examination, mild/moderate.  Skull:No acute osseous abnormality. No lytic or blastic lesion.  Orbits: No acute abnormality.  Brain: No evidence of acute abnormality, such as acute infarction, hemorrhage, hydrocephalus, or mass lesion/mass effect. Similar pattern of extensive, confluent bilateral cerebral white matter low density. Mild cerebral volume loss. Extensive intracranial atherosclerotic calcification.  IMPRESSION: 1. No evidence of acute intracranial disease. 2. Advanced chronic small vessel ischemia.   Electronically Signed   By: Tiburcio Pea M.D.   On: 03/08/2013 22:58    EKG Interpretation   None       MDM   1. Delirium   2. Dehydration   3. Dementia   4. Parkinson disease     The patient is a very elderly appearing 67 year old female with a history of dementia, Parkinson's disease, CVA, chronic UTI who presents with acute delirium. Patient was admitted to this institution 1 month ago for similar symptoms including hallucinations, decreased mental status, increasing listlessness. At that time, she was found to be dehydrated as well as possibly overmedicated on her carbidopa. Her dosages were cut in half and she was discharged home much improved from a mental status. At baseline, the patient is able  to participate in her activities of daily living with assistance as well as carry on somewhat comfortable conversations. Over the last 3 days she has complained of tactile hallucinations with crawling on her skin, feeling of her throat closing up and she cannot swallow, referring to the elevator in her 1 level tractor-trailer house. Husband is her only caregiver and they live at home. They have no in-house assistance.  On exam the patient is afebrile, clinically stable. She is oriented only to person. Throughout my exam, she mumbles  continuously without any clear, notable language. Additionally, she is very slow to follow commands and will only minimally squeeze my hands on both sides when asked. Nadir of physical exam unremarkable. Concern for delirium on top of dementia in this elderly patient. Will evaluate for altered mental status with CT head, chest x-ray, full labs, urinalysis. Pending workup, patient will need admission for delirium.  UA without signs of UTI. Lactate normal. No anemia, leukocytosis, significant electrolyte abnormality, or renal impairment. CXR shows no consolidation, effusion, pneumothorax, or widened mediastinum. EKG without signs of ischemia. CT head without ICH, CVA. Will consult medicine for admission for delirium.  Patient was discussed with my attending, Dr. Ethelda Chick.     Dorna Leitz, MD 03/09/13 [facility]

## 2013-03-08 NOTE — ED Notes (Signed)
Pt family states that pt was seen 3 weeks ago for hallucination and AMS. Pt stayed in the hospital for a week with different medication rxed (pt husband does not know which ones left papers at home) according to husband pt has been getting progressively worse with AMS and Hallucinations talking to people not there, thinking there are objects on her, and not responding appropriately. Pt in triage does not know name but knows birthdate, will no follow commands as far as to lift arm or leg, pt continues to answer question inappropitaely with what year is it? I have many, etc.

## 2013-03-08 NOTE — ED Notes (Signed)
Patient transported to CT 

## 2013-03-08 NOTE — ED Provider Notes (Signed)
Husband reports the patient has been progressively more confused, hallucinating, describing panels and an elevator and the trailer in which they live over the past 4 weeks. On exam patient does not follow commands does not answer questions appropriately. Chronically ill-appearing  Chelsey Sou, MD 03/08/13 2209

## 2013-03-08 NOTE — ED Notes (Signed)
Assisted nurse with in and out cath. 

## 2013-03-09 ENCOUNTER — Ambulatory Visit: Payer: Self-pay | Admitting: Neurology

## 2013-03-09 ENCOUNTER — Encounter (HOSPITAL_COMMUNITY): Payer: Self-pay | Admitting: Internal Medicine

## 2013-03-09 DIAGNOSIS — G20A1 Parkinson's disease without dyskinesia, without mention of fluctuations: Secondary | ICD-10-CM

## 2013-03-09 DIAGNOSIS — R404 Transient alteration of awareness: Secondary | ICD-10-CM

## 2013-03-09 DIAGNOSIS — G20 Parkinson's disease: Secondary | ICD-10-CM

## 2013-03-09 DIAGNOSIS — R410 Disorientation, unspecified: Secondary | ICD-10-CM

## 2013-03-09 DIAGNOSIS — F0390 Unspecified dementia without behavioral disturbance: Secondary | ICD-10-CM

## 2013-03-09 DIAGNOSIS — I10 Essential (primary) hypertension: Secondary | ICD-10-CM

## 2013-03-09 MED ORDER — NITROFURANTOIN MACROCRYSTAL 50 MG PO CAPS
50.0000 mg | ORAL_CAPSULE | Freq: Every day | ORAL | Status: DC
  Administered 2013-03-09: 50 mg via ORAL
  Filled 2013-03-09 (×2): qty 1

## 2013-03-09 MED ORDER — METOPROLOL TARTRATE 25 MG PO TABS
25.0000 mg | ORAL_TABLET | Freq: Two times a day (BID) | ORAL | Status: DC
  Administered 2013-03-09 – 2013-03-16 (×15): 25 mg via ORAL
  Filled 2013-03-09 (×16): qty 1

## 2013-03-09 MED ORDER — ACETAMINOPHEN 325 MG PO TABS
650.0000 mg | ORAL_TABLET | Freq: Four times a day (QID) | ORAL | Status: DC | PRN
  Administered 2013-03-12 – 2013-03-16 (×5): 650 mg via ORAL
  Filled 2013-03-09 (×4): qty 2

## 2013-03-09 MED ORDER — TETANUS-DIPHTH-ACELL PERTUSSIS 5-2.5-18.5 LF-MCG/0.5 IM SUSP
0.5000 mL | Freq: Once | INTRAMUSCULAR | Status: DC
  Filled 2013-03-09: qty 0.5

## 2013-03-09 MED ORDER — SODIUM CHLORIDE 0.9 % IV SOLN: INTRAVENOUS | Status: DC

## 2013-03-09 MED ORDER — ACETAMINOPHEN 650 MG RE SUPP
650.0000 mg | Freq: Four times a day (QID) | RECTAL | Status: DC | PRN
  Administered 2013-03-15: 650 mg via RECTAL
  Filled 2013-03-09: qty 1

## 2013-03-09 MED ORDER — ALPRAZOLAM 0.5 MG PO TABS
0.5000 mg | ORAL_TABLET | Freq: Three times a day (TID) | ORAL | Status: DC | PRN
Start: 2013-03-09 — End: 2013-03-09

## 2013-03-09 MED ORDER — CARBIDOPA-LEVODOPA 25-100 MG PO TABS
0.5000 | ORAL_TABLET | ORAL | Status: DC
  Administered 2013-03-09 – 2013-03-15 (×11): 0.5 via ORAL
  Filled 2013-03-09 (×14): qty 0.5

## 2013-03-09 MED ORDER — CLONIDINE HCL 0.1 MG PO TABS
0.1000 mg | ORAL_TABLET | Freq: Every day | ORAL | Status: DC
  Administered 2013-03-09 – 2013-03-16 (×8): 0.1 mg via ORAL
  Filled 2013-03-09 (×8): qty 1

## 2013-03-09 MED ORDER — SODIUM CHLORIDE 0.9 % IV SOLN
INTRAVENOUS | Status: AC
  Administered 2013-03-09: 05:00:00 via INTRAVENOUS

## 2013-03-09 MED ORDER — CARBIDOPA-LEVODOPA-ENTACAPONE 31.25-125-200 MG PO TABS: 1.0000 | ORAL_TABLET | Freq: Three times a day (TID) | ORAL | Status: DC

## 2013-03-09 MED ORDER — ONDANSETRON HCL 4 MG PO TABS: 4.0000 mg | ORAL_TABLET | Freq: Four times a day (QID) | ORAL | Status: DC | PRN

## 2013-03-09 MED ORDER — ONDANSETRON HCL 4 MG/2ML IJ SOLN: 4.0000 mg | Freq: Three times a day (TID) | INTRAMUSCULAR | Status: DC | PRN

## 2013-03-09 MED ORDER — POLYETHYLENE GLYCOL 3350 17 G PO PACK
17.0000 g | PACK | Freq: Every day | ORAL | Status: DC
  Administered 2013-03-09 – 2013-03-16 (×5): 17 g via ORAL
  Filled 2013-03-09 (×8): qty 1

## 2013-03-09 MED ORDER — HALOPERIDOL 1 MG PO TABS
1.0000 mg | ORAL_TABLET | Freq: Four times a day (QID) | ORAL | Status: DC | PRN
  Filled 2013-03-09: qty 1

## 2013-03-09 MED ORDER — ENTACAPONE 200 MG PO TABS
200.0000 mg | ORAL_TABLET | Freq: Every day | ORAL | Status: DC
  Administered 2013-03-10 – 2013-03-15 (×6): 200 mg via ORAL
  Filled 2013-03-09 (×9): qty 1

## 2013-03-09 MED ORDER — PANTOPRAZOLE SODIUM 40 MG PO TBEC
40.0000 mg | DELAYED_RELEASE_TABLET | Freq: Every day | ORAL | Status: DC
  Administered 2013-03-09 – 2013-03-16 (×8): 40 mg via ORAL
  Filled 2013-03-09 (×7): qty 1

## 2013-03-09 MED ORDER — LORAZEPAM 2 MG/ML IJ SOLN: 0.2500 mg | Freq: Four times a day (QID) | INTRAMUSCULAR | Status: DC | PRN

## 2013-03-09 MED ORDER — FERROUS SULFATE 325 (65 FE) MG PO TABS
325.0000 mg | ORAL_TABLET | Freq: Two times a day (BID) | ORAL | Status: DC
  Administered 2013-03-09 – 2013-03-16 (×15): 325 mg via ORAL
  Filled 2013-03-09 (×16): qty 1

## 2013-03-09 MED ORDER — DIPHENHYDRAMINE HCL 25 MG PO CAPS: 25.0000 mg | ORAL_CAPSULE | Freq: Three times a day (TID) | ORAL | Status: DC | PRN

## 2013-03-09 MED ORDER — AMANTADINE HCL 100 MG PO CAPS
100.0000 mg | ORAL_CAPSULE | Freq: Two times a day (BID) | ORAL | Status: DC
  Administered 2013-03-09 – 2013-03-13 (×9): 100 mg via ORAL
  Filled 2013-03-09 (×10): qty 1

## 2013-03-09 MED ORDER — ONDANSETRON HCL 4 MG/2ML IJ SOLN: 4.0000 mg | Freq: Four times a day (QID) | INTRAMUSCULAR | Status: DC | PRN

## 2013-03-09 MED ORDER — HYDRALAZINE HCL 20 MG/ML IJ SOLN
10.0000 mg | INTRAMUSCULAR | Status: DC | PRN
  Administered 2013-03-09 – 2013-03-16 (×4): 10 mg via INTRAVENOUS
  Filled 2013-03-09 (×4): qty 1

## 2013-03-09 MED ORDER — HALOPERIDOL LACTATE 5 MG/ML IJ SOLN
1.0000 mg | Freq: Four times a day (QID) | INTRAMUSCULAR | Status: DC | PRN
  Administered 2013-03-09: 1 mg via INTRAMUSCULAR
  Administered 2013-03-10: 22:00:00 via INTRAMUSCULAR
  Filled 2013-03-09 (×3): qty 1

## 2013-03-09 MED ORDER — ENTACAPONE 200 MG PO TABS
100.0000 mg | ORAL_TABLET | ORAL | Status: DC
  Administered 2013-03-09: 100 mg via ORAL
  Administered 2013-03-09: 21:00:00 via ORAL
  Administered 2013-03-10 – 2013-03-15 (×12): 100 mg via ORAL
  Filled 2013-03-09 (×14): qty 0.5

## 2013-03-09 MED ORDER — ENOXAPARIN SODIUM 30 MG/0.3ML ~~LOC~~ SOLN
30.0000 mg | SUBCUTANEOUS | Status: DC
Start: 2013-03-09 — End: 2013-03-16
  Administered 2013-03-09 – 2013-03-15 (×6): 30 mg via SUBCUTANEOUS
  Filled 2013-03-09 (×8): qty 0.3

## 2013-03-09 MED ORDER — AMLODIPINE BESYLATE 2.5 MG PO TABS
2.5000 mg | ORAL_TABLET | Freq: Every day | ORAL | Status: DC
  Administered 2013-03-09 – 2013-03-16 (×8): 2.5 mg via ORAL
  Filled 2013-03-09 (×8): qty 1

## 2013-03-09 NOTE — Progress Notes (Signed)
[facility] PROGRESS NOTE  [name] AOZ:[id number] DOB: Dec 20, 1945 DOA: 03/08/2013 PCP: Aida Puffer, MD  Assessment/Plan: 67 y.o. female with known history of advanced dementia, Parkinson's disease and recurrent UTI on chronic suppressive therapy was brought to the ER by patient's husband as patient was found to be getting increasingly confused. Patient was admitted last month for similar condition and improved after her carbidopa dose was adjusted. Patient has been states that since her discharge she was stable for a few days but later on started getting hallucinations again.   1. Advanced dementia, parkinson's disease with delirium, behavioral symptoms; neuro exam non focal; CT head: no acute findings; no s/s of infection; UA , CXR unremarkable;   -per husband: patient has been feeling increasingly difficult to manage patient at home. C/s palliative care for hospice care evaluation: meeting tomorrow  -hold benzo, benadryl --> worse confusion; use haldol prn  2. Mild dehydration - gently hydrate. Recheck metabolic panel in a.m.  3. Chronic recurrent UTI - on suppressive antibiotic therapy. 4. Hypertension - continue home medications. 5. Right middle finger laceration - tetanus toxoid ordered. 6. Anemia chronic; no s/s of acute bleeding; monitor   Code Status: DNR  Family Communication: d/w husband over the phone  (indicate person spoken with, relationship, and if by phone, the number) Disposition Plan: pend family decision , likely placement    Consultants:  Palliative care   Procedures:  CTC  Antibiotics:  None  (indicate start date, and stop date if known)  HPI/Subjective: Alert, but confused   Objective: Filed Vitals:   03/09/13 0626  BP: 144/84  Pulse: 88  Temp: 98.8 F (37.1 C)  Resp: 16    Intake/Output Summary (Last 24 hours) at 03/09/13 1038 Last data filed at 03/09/13 0947  Gross per 24 hour  Intake      0 ml  Output     47 ml  Net    -47 ml    Filed Weights   03/09/13 0113  Weight: 45.859 kg (101 lb 1.6 oz)    Exam:   General:  Alert   Cardiovascular: s1,s2 rrr  Respiratory: cta BL   Abdomen: soft, nt, nd   Musculoskeletal: no edema    Data Reviewed: Basic Metabolic Panel:  Recent Labs Lab 03/08/13 2039  NA 144  K 4.3  CL 109  CO2 23  GLUCOSE 103*  BUN 33*  CREATININE 1.00  CALCIUM 9.1   Liver Function Tests:  Recent Labs Lab 03/08/13 2039  AST 17  ALT 10  ALKPHOS 104  BILITOT 0.4  PROT 6.5  ALBUMIN 3.8   No results found for this basename: LIPASE, AMYLASE,  in the last 168 hours No results found for this basename: AMMONIA,  in the last 168 hours CBC:  Recent Labs Lab 03/08/13 2039  WBC 7.6  NEUTROABS 4.9  HGB 10.2*  HCT 28.8*  MCV 98.0  PLT 124*   Cardiac Enzymes: No results found for this basename: CKTOTAL, CKMB, CKMBINDEX, TROPONINI,  in the last 168 hours BNP (last 3 results) No results found for this basename: PROBNP,  in the last 8760 hours CBG: No results found for this basename: GLUCAP,  in the last 168 hours  No results found for this or any previous visit (from the past 240 hour(s)).   Studies: Dg Chest 2 View  03/08/2013   CLINICAL DATA:  Hallucination and altered mental status.  EXAM: CHEST  2 VIEW  COMPARISON:  10/17/2011  FINDINGS: Limited lateral projection due  to patient positioning.  Mild streaky lower lung opacities, likely scarring or atelectasis given morphology. No frank consolidation or edema. No effusion or pneumothorax. Normal heart size.  Diffuse degenerative endplate spurring.  IMPRESSION: Probable mild atelectasis at the bases. No edema or consolidationsuspected .   Electronically Signed   By: Tiburcio Pea M.D.   On: 03/08/2013 23:18   Ct Head Wo Contrast  03/08/2013   CLINICAL DATA:  Hallucinations with loss of consciousness  EXAM: CT HEAD WITHOUT CONTRAST  TECHNIQUE: Contiguous axial images were obtained from the base of the skull through the  vertex without intravenous contrast.  COMPARISON:  02/01/2013  FINDINGS: Motion degraded examination, mild/moderate.  Skull:No acute osseous abnormality. No lytic or blastic lesion.  Orbits: No acute abnormality.  Brain: No evidence of acute abnormality, such as acute infarction, hemorrhage, hydrocephalus, or mass lesion/mass effect. Similar pattern of extensive, confluent bilateral cerebral white matter low density. Mild cerebral volume loss. Extensive intracranial atherosclerotic calcification.  IMPRESSION: 1. No evidence of acute intracranial disease. 2. Advanced chronic small vessel ischemia.   Electronically Signed   By: Tiburcio Pea M.D.   On: 03/08/2013 22:58    Scheduled Meds: . amantadine  100 mg Oral BID  . amLODipine  2.5 mg Oral Daily  . carbidopa-levodopa-entacapone  1 tablet Oral TID  . cloNIDine  0.1 mg Oral Daily  . enoxaparin (LOVENOX) injection  30 mg Subcutaneous Q24H  . ferrous sulfate  325 mg Oral BID  . metoprolol tartrate  25 mg Oral BID  . nitrofurantoin  50 mg Oral QHS  . pantoprazole  40 mg Oral Daily  . polyethylene glycol  17 g Oral Daily  . TDaP  0.5 mL Intramuscular Once   Continuous Infusions: . sodium chloride 75 mL/hr at 03/09/13 0501    Principal Problem:   Delirium Active Problems:   Parkinson disease   Dementia    Time spent: >35 minutes     Esperanza Sheets  [facility] Pager [phone]. If 7PM-7AM, please contact night-coverage at [email or web address], password [facility] 03/09/2013, 10:38 AM  LOS: 1 day

## 2013-03-09 NOTE — H&P (Signed)
[facility] History and Physical  [name]:[id number] DOB: 1945-12-11 DOA: 03/08/2013  Referring physician: ER physician. PCP: Aida Puffer, MD  Specialists: Dr. Anne Hahn. Neurologist.  Chief Complaint: Hallucinations.  History obtained from patient's husband through the phone.  HPI: [name] is a 67 y.o. female with known history of advanced dementia, Parkinson's disease and recurrent UTI on chronic suppressive therapy was brought to the ER by patient's husband as patient was found to be getting increasingly confused. Patient was admitted last month for similar condition and improved after her carbidopa dose was adjusted. Patient has been states that since her discharge she was stable for a few days but later on started getting hallucinations again. And over the last few days it has markedly increased and has become unmanageable as patient has to be closely observed all the time. She also cut her finger few days ago by accidentally holding onto a spade. And in the last few days patient has not been eating well. In the ER patient had CT head and lab works showed all unremarkable except for mildly elevated BUN. Patient has been admitted for delirium with history of dementia.  Review of Systems: As presented in the history of presenting illness, rest negative.  Past Medical History  Diagnosis Date  . Spondylolisthesis   . Lumbar stenosis   . Depression   . Parkinson disease   . Asthma   . Hemangioma     left posterior  . Hypertension   . CVA (cerebrovascular accident)     pts lawyer stold her she had a stroke  . Sleep apnea     does not use a cpap machine  . Blood transfusion 1976  . Chronic UTI   . Hepatitis 1973    hepatitis A ?  Marland Kitchen GERD (gastroesophageal reflux disease)   . Anxiety   . Lumbar radiculopathy     parkinsons  . Memory disorder   . Gait disorder   . Obstructive sleep apnea   . History of seizures   . Gout   . Organic heart disease, NYHA class  1   . History of frequent urinary tract infections   . Chronic back pain   . Hearing deficit    Past Surgical History  Procedure Laterality Date  . Laminectomy    . Cardiac catheterization  2003     Angiographically patent coronary arteries with luminal irregularities in mid left anterior descending coronary artery and mid right coronary  artery. Normal left ventricular systolic function, ejection fraction estimate of  60 to 65% Normal abdominal aorta.   Normal renal arteries.  . Abdominal hysterectomy    . Foot surgery    . Anal fissure surgery    . Appendectomy    . Back surgery    . Lumbar fusion  10/23/2011   Social History:  reports that she has quit smoking. She has never used smokeless tobacco. She reports that she does not drink alcohol or use illicit drugs. Where does patient live home. Can patient participate in ADLs? No.  Allergies  Allergen Reactions  . Estrogens Hives and Swelling  . Tramadol Other (See Comments)    Unsure of reaction per patient  . Hydromorphone Rash  . Neurontin [Gabapentin] Other (See Comments)    Urinary problems  . Olanzapine Itching and Rash    Family History:  Family History  Problem Relation Age of Onset  . Coronary artery disease    . Anesthesia problems Neg Hx   . Cancer  Mother   . Kidney cancer Mother   . Heart disease Mother   . Depression Mother   . Heart disease Father   . Brain cancer Sister   . Depression Sister   . Diabetes Brother   . Diabetes Sister   . Depression Other   . Depression Son       Prior to Admission medications   Medication Sig Start Date End Date Taking? Authorizing Provider  acetaminophen (TYLENOL) 500 MG tablet Take 500-1,000 mg by mouth every 6 (six) hours as needed for pain.   Yes Historical Provider, MD  ALPRAZolam Prudy Feeler) 1 MG tablet Take 0.5-1 mg by mouth 3 (three) times daily as needed for anxiety.    Yes Historical Provider, MD  amantadine (SYMMETREL) 100 MG capsule Take 100 mg by mouth 2  (two) times daily.   Yes Historical Provider, MD  amLODipine (NORVASC) 2.5 MG tablet Take 2.5 mg by mouth daily.   Yes Historical Provider, MD  carbidopa-levodopa-entacapone (STALEVO) 31.25-125-200 MG per tablet Take 1 tablet by mouth 3 (three) times daily. One full tablet in the morning and 1/2 tablet in the afternoon and evening. 02/08/13  Yes [name], MD  cloNIDine (CATAPRES) 0.1 MG tablet Take 0.1 mg by mouth daily.   Yes Historical Provider, MD  diphenhydrAMINE (BENADRYL) 25 MG tablet Take 25 mg by mouth every 8 (eight) hours as needed for allergies or sleep.   Yes Historical Provider, MD  ferrous sulfate 325 (65 FE) MG EC tablet Take 325 mg by mouth 2 (two) times daily.   Yes Historical Provider, MD  meloxicam (MOBIC) 15 MG tablet Take 15 mg by mouth daily.   Yes Historical Provider, MD  metoprolol tartrate (LOPRESSOR) 25 MG tablet Take 25 mg by mouth 2 (two) times daily.   Yes Historical Provider, MD  nitrofurantoin (MACRODANTIN) 50 MG capsule Take 50 mg by mouth at bedtime.   Yes Historical Provider, MD  omeprazole (PRILOSEC) 20 MG capsule Take 20 mg by mouth daily.   Yes Historical Provider, MD  polyethylene glycol (MIRALAX / GLYCOLAX) packet Take 17 g by mouth daily.   Yes Historical Provider, MD  sulfamethoxazole-trimethoprim (BACTRIM DS,SEPTRA DS) 800-160 MG per tablet Take 1 tablet by mouth 2 (two) times daily. Started 03/08/13, for 10 days, ending 03/17/13   Yes Historical Provider, MD    Physical Exam: Filed Vitals:   03/08/13 2030 03/08/13 2335 03/09/13 0038 03/09/13 0113  BP: 153/71  154/68 179/79  Pulse: 92 80 72 73  Temp: 98.4 F (36.9 C)   98.3 F (36.8 C)  TempSrc: Oral   Axillary  Resp: 16 19 20 18   Height:    5' (1.524 m)  Weight:    45.859 kg (101 lb 1.6 oz)  SpO2: 100% 98% 99% 98%     General:  Well developed and moderately nourished.  Eyes:  Anicteric no pallor.  ENT: No discharge from ears eyes nose mouth.  Neck: No mass felt.  Cardiovascular:  S1-S2 heard.  Respiratory: No rhonchi or crepitations.  Abdomen: Soft nontender bowel sounds present.  Skin: No rash.  Musculoskeletal: No edema. Has a cut in the middle finger of the right hand.  Psychiatric: Patient is not following any commands.  Neurologic: Patient is confused and does not follow commands and is not oriented to her name on my exam.  Labs on Admission:  Basic Metabolic Panel:  Recent Labs Lab 03/08/13 2039  NA 144  K 4.3  CL 109  CO2 23  GLUCOSE 103*  BUN 33*  CREATININE 1.00  CALCIUM 9.1   Liver Function Tests:  Recent Labs Lab 03/08/13 2039  AST 17  ALT 10  ALKPHOS 104  BILITOT 0.4  PROT 6.5  ALBUMIN 3.8   No results found for this basename: LIPASE, AMYLASE,  in the last 168 hours No results found for this basename: AMMONIA,  in the last 168 hours CBC:  Recent Labs Lab 03/08/13 2039  WBC 7.6  NEUTROABS 4.9  HGB 10.2*  HCT 28.8*  MCV 98.0  PLT 124*   Cardiac Enzymes: No results found for this basename: CKTOTAL, CKMB, CKMBINDEX, TROPONINI,  in the last 168 hours  BNP (last 3 results) No results found for this basename: PROBNP,  in the last 8760 hours CBG: No results found for this basename: GLUCAP,  in the last 168 hours  Radiological Exams on Admission: Dg Chest 2 View  03/08/2013   CLINICAL DATA:  Hallucination and altered mental status.  EXAM: CHEST  2 VIEW  COMPARISON:  10/17/2011  FINDINGS: Limited lateral projection due to patient positioning.  Mild streaky lower lung opacities, likely scarring or atelectasis given morphology. No frank consolidation or edema. No effusion or pneumothorax. Normal heart size.  Diffuse degenerative endplate spurring.  IMPRESSION: Probable mild atelectasis at the bases. No edema or consolidationsuspected .   Electronically Signed   By: Tiburcio Pea M.D.   On: 03/08/2013 23:18   Ct Head Wo Contrast  03/08/2013   CLINICAL DATA:  Hallucinations with loss of consciousness  EXAM: CT HEAD WITHOUT  CONTRAST  TECHNIQUE: Contiguous axial images were obtained from the base of the skull through the vertex without intravenous contrast.  COMPARISON:  02/01/2013  FINDINGS: Motion degraded examination, mild/moderate.  Skull:No acute osseous abnormality. No lytic or blastic lesion.  Orbits: No acute abnormality.  Brain: No evidence of acute abnormality, such as acute infarction, hemorrhage, hydrocephalus, or mass lesion/mass effect. Similar pattern of extensive, confluent bilateral cerebral white matter low density. Mild cerebral volume loss. Extensive intracranial atherosclerotic calcification.  IMPRESSION: 1. No evidence of acute intracranial disease. 2. Advanced chronic small vessel ischemia.   Electronically Signed   By: Tiburcio Pea M.D.   On: 03/08/2013 22:58     Assessment/Plan Principal Problem:   Delirium Active Problems:   Parkinson disease   Dementia   1. Delirium in a patient with dementia - ammonia levels are pending. Will place patient on when necessary Ativan. As per the discussions had with patient's husband, patient's husband has been feeling increasingly difficult to manage patient at home. For now we will keep patient on when necessary IV Ativan for any agitation. May consult neurologist for any new suggestions. I have consulted palliative team for goals of care. Patient may need placement as patient's husband is feeling increasingly difficult to manage at home. 2. Mild dehydration - gently hydrate. Recheck metabolic panel in a.m.  3. Chronic recurrent UTI - on suppressive antibiotic therapy. 4. Hypertension - continue home medications. 5. Right middle finger laceration - tetanus toxoid ordered.    Code Status: DO NOT RESUSCITATE.  Family Communication: The patient's husband. Disposition Plan: Admit to inpatient.    Nicolemarie Wooley N. [facility] Pager [phone].  If 7PM-7AM, please contact night-coverage [email or web address] Password [email or web address] 03/09/2013, 4:10  AM

## 2013-03-09 NOTE — Progress Notes (Signed)
Did not give TDaP vaccine, pts husband states she received it on 03/08/2013 by urgent care.

## 2013-03-09 NOTE — Progress Notes (Signed)
Pt became very agitated, pulled IV out, and had attempts to get out of bed. PRN medication administered with relief. Will continue to monitor.

## 2013-03-09 NOTE — Progress Notes (Signed)
Late entry Call back received from patient's husband to confirm meeting time for goals of care discussion for tomorrow, Wednesday 03/10/13 @ 4:30 pm   Valente David, RN 03/09/2013, 9:06 PM Palliative Medicine Team RN Liaison [phone]

## 2013-03-09 NOTE — ED Provider Notes (Signed)
I have personally seen and examined the patient.  I have discussed the plan of care with the resident.  I have reviewed the documentation on PMH/FH/Soc. History.  I have reviewed the documentation of the resident and agree.  Doug Sou, MD 03/09/13 [phone]

## 2013-03-09 NOTE — Progress Notes (Signed)
Palliative Medicine Team consult for goals of care, hospice options, received spoke with patient's husband who indicates he wants his daughter and son to be present for discussion and will contact team phone to confirm meeting time for tomorrow, Wednesday 03/10/13    Valente David, RN 03/09/2013, 9:22 AM Palliative Medicine Team RN Liaison [phone]

## 2013-03-10 DIAGNOSIS — G20 Parkinson's disease: Secondary | ICD-10-CM

## 2013-03-10 DIAGNOSIS — R269 Unspecified abnormalities of gait and mobility: Secondary | ICD-10-CM

## 2013-03-10 DIAGNOSIS — Z515 Encounter for palliative care: Secondary | ICD-10-CM

## 2013-03-10 DIAGNOSIS — G20A1 Parkinson's disease without dyskinesia, without mention of fluctuations: Secondary | ICD-10-CM

## 2013-03-10 DIAGNOSIS — E860 Dehydration: Secondary | ICD-10-CM

## 2013-03-10 LAB — URINE CULTURE
Colony Count: NO GROWTH
Culture: NO GROWTH

## 2013-03-10 MED ORDER — HALOPERIDOL LACTATE 5 MG/ML IJ SOLN: 2.5000 mg | Freq: Once | INTRAMUSCULAR | Status: DC

## 2013-03-10 MED ORDER — HALOPERIDOL LACTATE 5 MG/ML IJ SOLN
2.5000 mg | Freq: Once | INTRAMUSCULAR | Status: AC
  Administered 2013-03-10: 2.5 mg via INTRAMUSCULAR
  Filled 2013-03-10: qty 1

## 2013-03-10 MED ORDER — HALOPERIDOL LACTATE 5 MG/ML IJ SOLN
2.5000 mg | Freq: Once | INTRAMUSCULAR | Status: AC
  Administered 2013-03-10: 2.5 mg via INTRAMUSCULAR

## 2013-03-10 NOTE — Consult Note (Signed)
Patient [name]      DOB: Jul 09, 1945      BMW:[id number]     Consult Note from the Palliative Medicine Team at [facility]    Consult Requested by: Dr. Cena Benton     PCP: Aida Puffer, MD Reason for Consultation: Goals of care and related symptom recommendations    Phone Number:[phone]  Assessment of patients Current state: Patient is a 67 year old white female with a known past medical history for Parkinson's disease, and psychotic depression. Patient has had a long history of auditory at hallucinations. She is being cared for at home extensively by her spouse for the last year but he has been unable to maintain her behavior in the last several weeks to months. Patient was admitted with worsening confusion and treated for urinary tract infection as well as dehydration. I met with the patient's spouse, daughter Babette Relic and son Roanna Epley. Family is struggling with deciding on transition to full comfort care. At this time they would like to try to optimize her medical therapy and get her behavior under control. We discussed potential for inpatient psychiatric stay for unable to do so in the hospital. We have elected to ask psychiatry to see her first see if there is more appropriate medications to control her behavior, as she has had multiple adverse reactions to medications. Family does recognize that they will be unable to care for her furtherance a skilled nursing placement will be necessary. Have offered hospice services based on her advancing dementia. I provided them with hard choices books and a most form to review, we will repeat indicated after psychiatry sees the patient.   Goals of Care: 1.  Code Status: DO NOT RESUSCITATE DO NOT INTUBATE   2. Scope of Treatment continue current curative treatments including antibiotics, IV fluid hydration, medications for her parkinsonism and for her dementia. Continue to comfort feed   4. Disposition: Will need SNIF placement   3. Symptom  Management:   1. Anxiety/Agitation: Patient did well with Haldol. Would recommend continuing this medication until were able to have psychiatry to see. Depakote may be an option. 2. Pain: None at present use Tylenol 3. Bowel Regimen: Stable monitor  4. Psychosocial: Patient has been living at home with her spouse with advancing dementia. Her spouse is unable to care for her further at this time as she is begun to wander and has injured herself on numerous occasions. Patient evidently has auditory hallucinations that tell her to do things. She is supported by her daughter Babette Relic and her son Roanna Epley  5. Spiritual: Patient's spouse is a deep spiritual belief system. We did offer support        Patient Documents Completed or Given: Document Given Completed  Advanced Directives Pkt    MOST    DNR    Gone from My Sight    Hard Choices      Brief HPI: Patient is a 67 year old white female with advancing dementia who was admitted to the hospital status post worsening confusion and trauma to her finger. We've been asked to assist with goals of care and related symptom recommendations   ROS: Unable to fully obtained secondary to the patient's confabulatory speech    PMH:  Past Medical History  Diagnosis Date  . Spondylolisthesis   . Lumbar stenosis   . Depression   . Parkinson disease   . Asthma   . Hemangioma     left posterior  . Hypertension   . CVA (cerebrovascular accident)  pts lawyer stold her she had a stroke  . Sleep apnea     does not use a cpap machine  . Blood transfusion 1976  . Chronic UTI   . Hepatitis 1973    hepatitis A ?  Marland Kitchen GERD (gastroesophageal reflux disease)   . Anxiety   . Lumbar radiculopathy     parkinsons  . Memory disorder   . Gait disorder   . Obstructive sleep apnea   . History of seizures   . Gout   . Organic heart disease, NYHA class 1   . History of frequent urinary tract infections   . Chronic back pain   . Hearing deficit       PSH: Past Surgical History  Procedure Laterality Date  . Laminectomy    . Cardiac catheterization  2003     Angiographically patent coronary arteries with luminal irregularities in mid left anterior descending coronary artery and mid right coronary  artery. Normal left ventricular systolic function, ejection fraction estimate of  60 to 65% Normal abdominal aorta.   Normal renal arteries.  . Abdominal hysterectomy    . Foot surgery    . Anal fissure surgery    . Appendectomy    . Back surgery    . Lumbar fusion  10/23/2011   I have reviewed the FH and SH and  If appropriate update it with new information. Allergies  Allergen Reactions  . Estrogens Hives and Swelling  . Tramadol Other (See Comments)    Unsure of reaction per patient  . Hydromorphone Rash  . Neurontin [Gabapentin] Other (See Comments)    Urinary problems  . Olanzapine Itching and Rash   Scheduled Meds: . amantadine  100 mg Oral BID  . amLODipine  2.5 mg Oral Daily  . carbidopa-levodopa  0.5 tablet Oral Custom   And  . entacapone  100 mg Oral Custom  . cloNIDine  0.1 mg Oral Daily  . enoxaparin (LOVENOX) injection  30 mg Subcutaneous Q24H  . entacapone  200 mg Oral Q breakfast  . ferrous sulfate  325 mg Oral BID  . metoprolol tartrate  25 mg Oral BID  . pantoprazole  40 mg Oral Daily  . polyethylene glycol  17 g Oral Daily  . TDaP  0.5 mL Intramuscular Once   Continuous Infusions:  PRN Meds:.acetaminophen, acetaminophen, haloperidol, haloperidol lactate, hydrALAZINE, ondansetron (ZOFRAN) IV, ondansetron    BP 168/75  Pulse 58  Temp(Src) 98.1 F (36.7 C) (Oral)  Resp 20  Ht 5' (1.524 m)  Wt 45.859 kg (101 lb 1.6 oz)  BMI 19.74 kg/m2  SpO2 100%   PPS: 30%  No intake or output data in the 24 hours ending 03/10/13 2250 LBM: 03/10/2013                Physical Exam:  General: Pleasant but confabulatory HEENT:  Normocephalic/atraumatic pupils equal round reactive to light extraocular muscles  appear to be intact his membranes are moist Chest:   Decreased but clear CVS: Regular rate and rhythm positive S1 and S2 I don't appreciate an S3 or S4 Abdomen: Soft no grimace on palpation Ext: Multiple areas of bruising and a bandage on the right first digit Neuro: Confused to time place or person, comfortable with her family but does not recognize them by name  Labs: CBC    Component Value Date/Time   WBC 7.6 03/08/2013 2039   RBC 2.94* 03/08/2013 2039   HGB 10.2* 03/08/2013 2039   HCT 28.8* 03/08/2013  2039   PLT 124* 03/08/2013 2039   MCV 98.0 03/08/2013 2039   MCH 34.7* 03/08/2013 2039   MCHC 35.4 03/08/2013 2039   RDW 12.8 03/08/2013 2039   LYMPHSABS 1.5 03/08/2013 2039   MONOABS 1.0 03/08/2013 2039   EOSABS 0.2 03/08/2013 2039   BASOSABS 0.0 03/08/2013 2039     CMP     Component Value Date/Time   NA 144 03/08/2013 2039   K 4.3 03/08/2013 2039   CL 109 03/08/2013 2039   CO2 23 03/08/2013 2039   GLUCOSE 103* 03/08/2013 2039   BUN 33* 03/08/2013 2039   CREATININE 1.00 03/08/2013 2039   CALCIUM 9.1 03/08/2013 2039   PROT 6.5 03/08/2013 2039   ALBUMIN 3.8 03/08/2013 2039   AST 17 03/08/2013 2039   ALT 10 03/08/2013 2039   ALKPHOS 104 03/08/2013 2039   BILITOT 0.4 03/08/2013 2039   GFRNONAA 58* 03/08/2013 2039   GFRAA 67* 03/08/2013 2039    Chest Xray Reviewed/Impressions: Atelectasis no obvious infiltrate  CT scan of the Head Reviewed/Impressions: Advanced chronic small vessel disease    Time In Time Out Total Time Spent with Patient Total Overall Time  4:30 PM   5:45 PM   30 minutes   75 minutes     Greater than 50%  of this time was spent counseling and coordinating care related to the above assessment and plan.   Discussed with Dr. Norville Haggard L. Ladona Ridgel, MD MBA The Palliative Medicine Team at [facility] Phone: [phone] Pager: [phone]

## 2013-03-10 NOTE — Progress Notes (Signed)
   CARE MANAGEMENT NOTE 03/10/2013  Patient:  Chelsey, Brown   Account Number:  [id number]  Date Initiated:  03/10/2013  Documentation initiated by:  Jiles Crocker  Subjective/Objective Assessment:   ADMITTED WITH HALLUCINATIONS     Action/Plan:   PATIENT LIVES AT HOME WITH SPOUSE; PATIENT IS ACTIVE WITH [facility] FOR HHPT/ RN   Anticipated DC Date:  03/13/2013   Anticipated DC Plan:  POSSIBLY HOME W HOME HEALTH SERVICES; AWAITING FOR PALLIATIVE CARE MEETING FOR GOALS OF CARE;      DC Planning Services  CM consult         Status of service:  In process, will continue to follow Medicare Important Message given?  NA - LOS <3 / Initial given by admissions (If response is "NO", the following Medicare IM given date fields will be blank)  Per UR Regulation:  Reviewed for med. necessity/level of care/duration of stay  Comments:  10/15/2014Abelino Derrick RN,BSN,MHA [phone]

## 2013-03-10 NOTE — Progress Notes (Signed)
[facility] PROGRESS NOTE  COREN CROWNOVER NWG:[id number] DOB: 1946-04-12 DOA: 03/08/2013 PCP: Aida Puffer, MD  Assessment/Plan: 1. Advanced dementia, parkinson's disease with delirium, behavioral symptoms; neuro exam non focal; CT head: no acute findings; no s/s of infection; UA , CXR unremarkable;  -per husband: patient has been feeling increasingly difficult to manage patient at home. Palliative care meeting pending for GOC - Agree with holding benzo, benadryl --> worse confusion; use haldol prn agitation 2. Mild dehydration - On 75 cc normal saline. Recheck metabolic panel in a.m, order placed. 3. Chronic recurrent UTI - on suppressive antibiotic as outpatient. Due to decreased creatinine clearance to 40 on my calculation will plan on discontinuing nitrofurantoin. 4. Hypertension - Continue home medications. Will continue to monitor 5. Right middle finger laceration - tetanus toxoid ordered per prior PN 6. Anemia chronic: stable with no active bleeding reported.  Code Status: DNR Family Communication: d/c spouse at bedside Disposition Plan: Pending GOC discussion with palliative care team.   Consultants:  Palliative care  Procedures:  CT head: no evidence of acute intracranial disease.  Antibiotics:  Nitrofurantoin>>>d/c 03/10/13  HPI/Subjective: No new complaints. No acute issues reported overnight.  Objective: Filed Vitals:   03/10/13 0542  BP: 148/78  Pulse: 81  Temp: 98 F (36.7 C)  Resp: 20    Intake/Output Summary (Last 24 hours) at 03/10/13 1016 Last data filed at 03/09/13 1722  Gross per 24 hour  Intake    460 ml  Output      0 ml  Net    460 ml   Filed Weights   03/09/13 0113  Weight: 45.859 kg (101 lb 1.6 oz)    Exam:   General:  Pt in NAD, arousable on command. Resting comfortably  Cardiovascular: RRR, no MRG  Respiratory: CTA BL, no wheezes  Abdomen: soft, NT  Musculoskeletal: no clubbing.   Data Reviewed: Basic Metabolic  Panel:  Recent Labs Lab 03/08/13 2039  NA 144  K 4.3  CL 109  CO2 23  GLUCOSE 103*  BUN 33*  CREATININE 1.00  CALCIUM 9.1   Liver Function Tests:  Recent Labs Lab 03/08/13 2039  AST 17  ALT 10  ALKPHOS 104  BILITOT 0.4  PROT 6.5  ALBUMIN 3.8   No results found for this basename: LIPASE, AMYLASE,  in the last 168 hours No results found for this basename: AMMONIA,  in the last 168 hours CBC:  Recent Labs Lab 03/08/13 2039  WBC 7.6  NEUTROABS 4.9  HGB 10.2*  HCT 28.8*  MCV 98.0  PLT 124*   Cardiac Enzymes: No results found for this basename: CKTOTAL, CKMB, CKMBINDEX, TROPONINI,  in the last 168 hours BNP (last 3 results) No results found for this basename: PROBNP,  in the last 8760 hours CBG: No results found for this basename: GLUCAP,  in the last 168 hours  Recent Results (from the past 240 hour(s))  URINE CULTURE     Status: None   Collection Time    03/08/13 10:55 PM      Result Value Range Status   Specimen Description URINE, CATHETERIZED   Final   Special Requests NONE   Final   Culture  Setup Time     Final   Value: 03/09/2013 07:38     Performed at [facility] Count     Final   Value: NO GROWTH     Performed at [facility]   Culture     Final  Value: NO GROWTH     Performed at [facility]   Report Status 03/10/2013 FINAL   Final     Studies: Dg Chest 2 View  03/08/2013   CLINICAL DATA:  Hallucination and altered mental status.  EXAM: CHEST  2 VIEW  COMPARISON:  10/17/2011  FINDINGS: Limited lateral projection due to patient positioning.  Mild streaky lower lung opacities, likely scarring or atelectasis given morphology. No frank consolidation or edema. No effusion or pneumothorax. Normal heart size.  Diffuse degenerative endplate spurring.  IMPRESSION: Probable mild atelectasis at the bases. No edema or consolidationsuspected .   Electronically Signed   By: Tiburcio Pea M.D.   On: 03/08/2013 23:18   Ct  Head Wo Contrast  03/08/2013   CLINICAL DATA:  Hallucinations with loss of consciousness  EXAM: CT HEAD WITHOUT CONTRAST  TECHNIQUE: Contiguous axial images were obtained from the base of the skull through the vertex without intravenous contrast.  COMPARISON:  02/01/2013  FINDINGS: Motion degraded examination, mild/moderate.  Skull:No acute osseous abnormality. No lytic or blastic lesion.  Orbits: No acute abnormality.  Brain: No evidence of acute abnormality, such as acute infarction, hemorrhage, hydrocephalus, or mass lesion/mass effect. Similar pattern of extensive, confluent bilateral cerebral white matter low density. Mild cerebral volume loss. Extensive intracranial atherosclerotic calcification.  IMPRESSION: 1. No evidence of acute intracranial disease. 2. Advanced chronic small vessel ischemia.   Electronically Signed   By: Tiburcio Pea M.D.   On: 03/08/2013 22:58    Scheduled Meds: . amantadine  100 mg Oral BID  . amLODipine  2.5 mg Oral Daily  . carbidopa-levodopa  0.5 tablet Oral Custom   And  . entacapone  100 mg Oral Custom  . cloNIDine  0.1 mg Oral Daily  . enoxaparin (LOVENOX) injection  30 mg Subcutaneous Q24H  . entacapone  200 mg Oral Q breakfast  . ferrous sulfate  325 mg Oral BID  . metoprolol tartrate  25 mg Oral BID  . nitrofurantoin  50 mg Oral QHS  . pantoprazole  40 mg Oral Daily  . polyethylene glycol  17 g Oral Daily  . TDaP  0.5 mL Intramuscular Once   Continuous Infusions:   Principal Problem:   Delirium Active Problems:   Parkinson disease   Dementia    Time spent: > 30 minutes    [name]  [facility] Pager [phone] If 7PM-7AM, please contact night-coverage at [email or web address], password [facility] 03/10/2013, 10:16 AM  LOS: 2 days

## 2013-03-10 NOTE — Consult Note (Signed)
Patient ZO:XWRUE YUMI INSALACO      DOB: 06-Nov-1945      AVW:[id number]  Summary of Goals of care; full note to follow:  Met with patient's son, daughter and spouse- garland, tammy , and james.  Patient unable to participate secondary to dementia.  Family at their wits end to care for the patient.  Spouse recognizes that he can no longer care for her at home.  discussed options for evaluation by neuro/psych regarding titration of medications and ultimate placement for long term care.  Family understands that they will need to have her placed for her own safety.  They do want to titrate medications , however, to try to control her behavior.   She did well with haldol last evening, but per family did not do well in the past with Seroquel which had an opposite effect, zyprexa which gave her hive, and xanax which caused her to be like a "drunk man.  Will speak with primary MD in am about possible psych consult to help with meds (? Depakote).   Continue haldol prn for now.  Total time : 430 pm- 545 pm   Azariel Banik L. Ladona Ridgel, MD MBA The Palliative Medicine Team at [facility] Phone: [phone] Pager: [phone]

## 2013-03-10 NOTE — Progress Notes (Signed)
Chaplain provided care and support to patient/family. Patient slept off and on during the visit. Chaplain listened supportively as the patient's husband shared his feelings regarding his wife's current health status and their marriage of 40 years. Chaplain and patient's husband discussed scripture and talked about faith as a Theatre stage manager. Patient's husband said they do not have a good support system in place. Chaplain will follow up as needed and requested.   03/10/13 1100  Clinical Encounter Type  Visited With Patient and family together  Visit Type Initial;Spiritual support;Social support  Referral From Palliative care team  Spiritual Encounters  Spiritual Needs Emotional  Stress Factors  Family Stress Factors Health changes;Loss;Major life changes

## 2013-03-11 DIAGNOSIS — F0630 Mood disorder due to known physiological condition, unspecified: Secondary | ICD-10-CM

## 2013-03-11 LAB — BASIC METABOLIC PANEL
BUN: 27 mg/dL — ABNORMAL HIGH (ref 6–23)
Calcium: 9 mg/dL (ref 8.4–10.5)
Creatinine, Ser: 0.88 mg/dL (ref 0.50–1.10)
GFR calc Af Amer: 78 mL/min — ABNORMAL LOW (ref >90)
GFR calc non Af Amer: 67 mL/min — ABNORMAL LOW (ref >90)
Glucose, Bld: 147 mg/dL — ABNORMAL HIGH (ref 70–99)
Sodium: 145 mEq/L (ref 135–145)

## 2013-03-11 MED ORDER — RISPERIDONE 0.5 MG PO TBDP
0.5000 mg | ORAL_TABLET | Freq: Two times a day (BID) | ORAL | Status: DC
  Administered 2013-03-11 – 2013-03-13 (×4): 0.5 mg via ORAL
  Filled 2013-03-11 (×5): qty 1

## 2013-03-11 MED ORDER — HALOPERIDOL LACTATE 5 MG/ML IJ SOLN
2.5000 mg | Freq: Once | INTRAMUSCULAR | Status: AC
  Administered 2013-03-11: 2.5 mg via INTRAMUSCULAR
  Filled 2013-03-11: qty 1

## 2013-03-11 NOTE — Progress Notes (Signed)
[facility] PROGRESS NOTE  NATORI GUDINO NFA:[id number] DOB: 1946-05-27 DOA: 03/08/2013 PCP: Aida Puffer, MD  Assessment/Plan: 1. Advanced dementia, parkinson's disease with delirium, behavioral symptoms; neuro exam non focal; CT head: no acute findings; no s/s of infection; UA , CXR unremarkable;  -per husband: patient has been feeling increasingly difficult to manage patient at home. Palliative care meeting pending for GOC on 03/10/13. Plan is for Psychiatry consult for disposition plans given worsening dementia. - Agree with holding benzo, benadryl --> worse confusion; use haldol prn agitation, consulted psychiatry for further recommendations regarding symptom management given h/o allergic reactions and adverse reactions in past. 2. Mild dehydration - improved with gentle fluid hydration. 3. Chronic recurrent UTI - on suppressive antibiotic as outpatient. Due to decreased creatinine clearance to 40 on my calculation discontinued nitrofurantoin. 4. Hypertension - Continue home medications. Will continue to monitor 5. Right middle finger laceration - tetanus toxoid ordered per prior PN 6. Anemia chronic: stable with no active bleeding reported.  Code Status: DNR Family Communication: d/c spouse at bedside Disposition Plan: Pending GOC discussion with palliative care team.   Consultants:  Palliative care  Procedures:  CT head: no evidence of acute intracranial disease.  Antibiotics:  Nitrofurantoin>>>d/c 03/10/13  HPI/Subjective: No new complaints. No acute issues reported overnight. Awaiting psychiatry consult.  Objective: Filed Vitals:   03/11/13 1414  BP: 143/69  Pulse: 73  Temp: 99.6 F (37.6 C)  Resp: 20    Intake/Output Summary (Last 24 hours) at 03/11/13 1440 Last data filed at 03/11/13 1351  Gross per 24 hour  Intake    360 ml  Output      0 ml  Net    360 ml   Filed Weights   03/09/13 0113  Weight: 45.859 kg (101 lb 1.6 oz)     Exam:   General:  Pt in NAD, alert and awake  Cardiovascular: RRR, no MRG  Respiratory: CTA BL, no wheezes  Abdomen: soft, NT  Musculoskeletal: no clubbing.   Data Reviewed: Basic Metabolic Panel:  Recent Labs Lab 03/08/13 2039 03/11/13 0545  NA 144 145  K 4.3 3.8  CL 109 111  CO2 23 19  GLUCOSE 103* 147*  BUN 33* 27*  CREATININE 1.00 0.88  CALCIUM 9.1 9.0   Liver Function Tests:  Recent Labs Lab 03/08/13 2039  AST 17  ALT 10  ALKPHOS 104  BILITOT 0.4  PROT 6.5  ALBUMIN 3.8   No results found for this basename: LIPASE, AMYLASE,  in the last 168 hours No results found for this basename: AMMONIA,  in the last 168 hours CBC:  Recent Labs Lab 03/08/13 2039  WBC 7.6  NEUTROABS 4.9  HGB 10.2*  HCT 28.8*  MCV 98.0  PLT 124*   Cardiac Enzymes: No results found for this basename: CKTOTAL, CKMB, CKMBINDEX, TROPONINI,  in the last 168 hours BNP (last 3 results) No results found for this basename: PROBNP,  in the last 8760 hours CBG: No results found for this basename: GLUCAP,  in the last 168 hours  Recent Results (from the past 240 hour(s))  URINE CULTURE     Status: None   Collection Time    03/08/13 10:55 PM      Result Value Range Status   Specimen Description URINE, CATHETERIZED   Final   Special Requests NONE   Final   Culture  Setup Time     Final   Value: 03/09/2013 07:38     Performed at [facility]  [place] Count     Final   Value: NO GROWTH     Performed at [facility]   Culture     Final   Value: NO GROWTH     Performed at [facility]   Report Status 03/10/2013 FINAL   Final     Studies: No results found.  Scheduled Meds: . amantadine  100 mg Oral BID  . amLODipine  2.5 mg Oral Daily  . carbidopa-levodopa  0.5 tablet Oral Custom   And  . entacapone  100 mg Oral Custom  . cloNIDine  0.1 mg Oral Daily  . enoxaparin (LOVENOX) injection  30 mg Subcutaneous Q24H  . entacapone  200 mg Oral Q  breakfast  . ferrous sulfate  325 mg Oral BID  . metoprolol tartrate  25 mg Oral BID  . pantoprazole  40 mg Oral Daily  . polyethylene glycol  17 g Oral Daily  . TDaP  0.5 mL Intramuscular Once   Continuous Infusions:   Principal Problem:   Delirium Active Problems:   Parkinson disease   Dementia    Time spent: > 30 minutes    [name]  [facility] Pager [phone] If 7PM-7AM, please contact night-coverage at [email or web address], password [facility] 03/11/2013, 2:40 PM  LOS: 3 days

## 2013-03-11 NOTE — Consult Note (Signed)
Reason for Consult: Dementia and parkinson  Referring Physician: Dr. [name] is an 67 y.o. female.  HPI: [name] is a 66 y.o. female admitted to [facility] with known history of  dementia, Parkinson's disease and recurrent UTI for increased psychosis, behavioral problems and confused. Patient husband stated that it is too hard to deal with her visual and auditory hallucinations of seeing small children and calling them with names and asking him to feed them. She also refused to go into her home and eat because she perceived that is not her home and does not agree with husband. Patient husband stated that she did okay since last hospitalization and started going down hill over a week ago. Patient husband requested to use carbidopa and levodopa instead of using combinations which are costly. She also cut her finger few days ago by accidentally holding onto a spade. Patient is oriented to her name only at this time. She endorses seeing children everywhere and trying to talk to them from time to time.   Mental Status Examination: Patient is calm and cooperative, initially appeared sleeping quietly without distress and woke up herself while talking with husband [name]. She has responded verbally but still confused and talking without making sense. She hs no dentures at this time, and hard to follow up with her speech. Patient has fine mood and  affect was non congruent. He has normal speech. His thought process is tangential. Patient has denied suicidal, homicidal ideations, intentions or plans. Patient has auditory or visual hallucinations, and delusions thinking.    Past Medical History  Diagnosis Date  . Spondylolisthesis   . Lumbar stenosis   . Depression   . Parkinson disease   . Asthma   . Hemangioma     left posterior  . Hypertension   . CVA (cerebrovascular accident)     pts lawyer stold her she had a stroke  . Sleep apnea     does not use a cpap machine  .  Blood transfusion 1976  . Chronic UTI   . Hepatitis 1973    hepatitis A ?  Marland Kitchen GERD (gastroesophageal reflux disease)   . Anxiety   . Lumbar radiculopathy     parkinsons  . Memory disorder   . Gait disorder   . Obstructive sleep apnea   . History of seizures   . Gout   . Organic heart disease, NYHA class 1   . History of frequent urinary tract infections   . Chronic back pain   . Hearing deficit     Past Surgical History  Procedure Laterality Date  . Laminectomy    . Cardiac catheterization  2003     Angiographically patent coronary arteries with luminal irregularities in mid left anterior descending coronary artery and mid right coronary  artery. Normal left ventricular systolic function, ejection fraction estimate of  60 to 65% Normal abdominal aorta.   Normal renal arteries.  . Abdominal hysterectomy    . Foot surgery    . Anal fissure surgery    . Appendectomy    . Back surgery    . Lumbar fusion  10/23/2011    Family History  Problem Relation Age of Onset  . Coronary artery disease    . Anesthesia problems Neg Hx   . Cancer Mother   . Kidney cancer Mother   . Heart disease Mother   . Depression Mother   . Heart disease Father   . Brain  cancer Sister   . Depression Sister   . Diabetes Brother   . Diabetes Sister   . Depression Other   . Depression Son     Social History:  reports that she has quit smoking. She has never used smokeless tobacco. She reports that she does not drink alcohol or use illicit drugs.  Allergies:  Allergies  Allergen Reactions  . Estrogens Hives and Swelling  . Tramadol Other (See Comments)    Unsure of reaction per patient  . Hydromorphone Rash  . Neurontin [Gabapentin] Other (See Comments)    Urinary problems  . Olanzapine Itching and Rash    Medications: I have reviewed the patient's current medications.  Results for orders placed during the hospital encounter of 03/08/13 (from the past 48 hour(s))  BASIC METABOLIC PANEL      Status: Abnormal   Collection Time    03/11/13  5:45 AM      Result Value Range   Sodium 145  135 - 145 mEq/L   Potassium 3.8  3.5 - 5.1 mEq/L   Chloride 111  96 - 112 mEq/L   CO2 19  19 - 32 mEq/L   Glucose, Bld 147 (*) 70 - 99 mg/dL   BUN 27 (*) 6 - 23 mg/dL   Creatinine, Ser 1.61  0.50 - 1.10 mg/dL   Calcium 9.0  8.4 - 09.6 mg/dL   GFR calc non Af Amer 67 (*) >90 mL/min   GFR calc Af Amer 78 (*) >90 mL/min   Comment: (NOTE)     The eGFR has been calculated using the CKD EPI equation.     This calculation has not been validated in all clinical situations.     eGFR's persistently <90 mL/min signify possible Chronic Kidney     Disease.    No results found.  Positive for behavior problems and Auditory and visual hallucinations, and delusional thinking Blood pressure 143/69, pulse 73, temperature 99.6 F (37.6 C), temperature source Oral, resp. rate 20, height 5' (1.524 m), weight 45.859 kg (101 lb 1.6 oz), SpO2 99.00%.   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 03/11/2013, 3:33 PM

## 2013-03-11 NOTE — Progress Notes (Signed)
Pt became very agitated and aggressive. Pt kicking, spitting, yelling, and swinging at staff members, with multiple attempts to get out of bed. On call NP notified. Will continue to monitor.

## 2013-03-12 DIAGNOSIS — F068 Other specified mental disorders due to known physiological condition: Secondary | ICD-10-CM

## 2013-03-12 DIAGNOSIS — E119 Type 2 diabetes mellitus without complications: Secondary | ICD-10-CM

## 2013-03-12 DIAGNOSIS — F0391 Unspecified dementia with behavioral disturbance: Secondary | ICD-10-CM

## 2013-03-12 MED ORDER — HYDRALAZINE HCL 25 MG PO TABS
25.0000 mg | ORAL_TABLET | ORAL | Status: DC | PRN
  Administered 2013-03-12: 10 mg via ORAL
  Administered 2013-03-13 – 2013-03-14 (×4): 25 mg via ORAL
  Filled 2013-03-12 (×5): qty 1

## 2013-03-12 MED ORDER — HYDRALAZINE HCL 10 MG PO TABS
10.0000 mg | ORAL_TABLET | ORAL | Status: DC | PRN
  Administered 2013-03-12: 10 mg via ORAL
  Filled 2013-03-12 (×2): qty 1

## 2013-03-12 NOTE — Progress Notes (Signed)
Consulting civil engineer received called from Lupita Leash, CSW about pt possibly tx to [facility]F tomorrow and has bed available. Sitter removed from room in order to prevent discharge delay.

## 2013-03-12 NOTE — Progress Notes (Signed)
Patient ZO:XWRUE B Depaolo      DOB: September 06, 1945      AVW:[id number]   Palliative Medicine Team at [facility] Progress Note    Subjective: Seemingly more compliant today. Found sitting up in a chair chattering to her spouse about surgical procedures. Patient seems less agitated. Family desires placement at [facility]. I believe that if the medication is effective she would not need consideration for titration at another institution prior to this placement. Family understands if placement would be next step.     Filed Vitals:   03/12/13 2114  BP: 190/93  Pulse: 88  Temp: 98.4 F (36.9 C)  Resp: 18   Physical exam:  General: Pleasantly confused no acute distress Pupils equal round reactive to light extraocular muscles are intact his membranes are moist neck is supple there's no JVD  Chest is decreased but clear to auscultation Cardiovascular regular rate and rhythm positive S1 and S2 no S3-S4 Abdomen scaphoid nontender Extremities multiple areas of bruising and laceration less first forefinger neurologically: Pleasantly confused      Assessment and plan: 67 year old female with advanced dementia with behavioral disturbance. Patient has been started on Risperdal and appears to be slightly improved. We'll continue to monitor family has decided on skilled nursing placement in [place]  1. DO NOT RESUSCITATE, Renette Butters rod on chart 2. Dementia with behavior continue her current medications with the addition of risperidone. 3. Comfort feed Disposition to the floor medically stable 25 minutes   Stephone Gum L. Ladona Ridgel, MD MBA The Palliative Medicine Team at [facility] Phone: [phone] Pager: [phone]

## 2013-03-12 NOTE — Progress Notes (Signed)
[facility] PROGRESS NOTE  [name] ZOX:[id number] DOB: February 04, 1946 DOA: 03/08/2013 PCP: Aida Puffer, MD  Assessment/Plan: Dementia with psychosis and behavioral problems, parkinson's disease with delirium; neuro exam non focal; CT head: no acute findings; no s/s of infection; UA , CXR unremarkable;  -per husband: patient has been feeling increasingly difficult to manage patient at home. Palliative care meeting pending for GOC on 03/10/13. Psychiatry subsequently consult for disposition plans. At this juncture plan is to place in skilled nursing facility - please refer to psychiatry his recommendations listed on note on the 03/11/2013 for system management. 2. Mild dehydration - improved with gentle fluid hydration. 3. Chronic recurrent UTI - on suppressive antibiotic as outpatient. Due to decreased creatinine clearance to 40 on my calculation discontinued nitrofurantoin. 4. Hypertension - Continue home medications. Will continue to monitor 5. Right middle finger laceration - tetanus toxoid ordered per prior PN 6. Anemia chronic: stable with no active bleeding reported.  Code Status: DNR Family Communication: d/c spouse at bedside Disposition Plan: placement into skilled nursing facility once bed available   Consultants:  Palliative care  Procedures:  CT head: no evidence of acute intracranial disease.  Antibiotics:  Nitrofurantoin>>>d/c 03/10/13  HPI/Subjective: No new complaints. No acute issues reported overnight. Awaiting psychiatry consult. Patient is more alert but husband reports that she is still hallucinating  Objective: Filed Vitals:   03/12/13 1430  BP: 155/77  Pulse: 107  Temp: 98.4 F (36.9 C)  Resp: 20    Intake/Output Summary (Last 24 hours) at 03/12/13 1643 Last data filed at 03/12/13 1519  Gross per 24 hour  Intake    840 ml  Output      0 ml  Net    840 ml   Filed Weights   03/09/13 0113  Weight: 45.859 kg (101 lb 1.6 oz)     Exam:   General:  Pt in NAD, alert and awake  Cardiovascular: RRR, no MRG  Respiratory: CTA BL, no wheezes  Abdomen: soft, NT  Musculoskeletal: no clubbing.   Data Reviewed: Basic Metabolic Panel:  Recent Labs Lab 03/08/13 2039 03/11/13 0545  NA 144 145  K 4.3 3.8  CL 109 111  CO2 23 19  GLUCOSE 103* 147*  BUN 33* 27*  CREATININE 1.00 0.88  CALCIUM 9.1 9.0   Liver Function Tests:  Recent Labs Lab 03/08/13 2039  AST 17  ALT 10  ALKPHOS 104  BILITOT 0.4  PROT 6.5  ALBUMIN 3.8   No results found for this basename: LIPASE, AMYLASE,  in the last 168 hours No results found for this basename: AMMONIA,  in the last 168 hours CBC:  Recent Labs Lab 03/08/13 2039  WBC 7.6  NEUTROABS 4.9  HGB 10.2*  HCT 28.8*  MCV 98.0  PLT 124*   Cardiac Enzymes: No results found for this basename: CKTOTAL, CKMB, CKMBINDEX, TROPONINI,  in the last 168 hours BNP (last 3 results) No results found for this basename: PROBNP,  in the last 8760 hours CBG: No results found for this basename: GLUCAP,  in the last 168 hours  Recent Results (from the past 240 hour(s))  URINE CULTURE     Status: None   Collection Time    03/08/13 10:55 PM      Result Value Range Status   Specimen Description URINE, CATHETERIZED   Final   Special Requests NONE   Final   Culture  Setup Time     Final   Value: 03/09/2013 07:38  Performed at [facility] Count     Final   Value: NO GROWTH     Performed at [facility]   Culture     Final   Value: NO GROWTH     Performed at [facility]   Report Status 03/10/2013 FINAL   Final     Studies: No results found.  Scheduled Meds: . amantadine  100 mg Oral BID  . amLODipine  2.5 mg Oral Daily  . carbidopa-levodopa  0.5 tablet Oral Custom   And  . entacapone  100 mg Oral Custom  . cloNIDine  0.1 mg Oral Daily  . enoxaparin (LOVENOX) injection  30 mg Subcutaneous Q24H  . entacapone  200 mg Oral Q  breakfast  . ferrous sulfate  325 mg Oral BID  . metoprolol tartrate  25 mg Oral BID  . pantoprazole  40 mg Oral Daily  . polyethylene glycol  17 g Oral Daily  . risperiDONE  0.5 mg Oral BID  . TDaP  0.5 mL Intramuscular Once   Continuous Infusions:   Principal Problem:   Delirium Active Problems:   Parkinson disease   Dementia    Time spent: > 35 minutes    [name]  [facility] Pager [phone] If 7PM-7AM, please contact night-coverage at [email or web address], password [facility] 03/12/2013, 4:43 PM  LOS: 4 days

## 2013-03-12 NOTE — Progress Notes (Signed)
Bed offer has been received from [facility] which is the facility choice of patient's husband.  They can accept patient when medically stable.  Per nursing - patient still has a Recruitment consultant- she will need to be sitter free for 24 hours.  CSW attempted to discuss with patient's nurse but she was tied up with another patient and requested that I contact the charge nurse.  CSW spoke to [place]- Press photographer regarding issues with Recruitment consultant and that bed is available at [facility] tomorrow. She will discuss with MD.  CSW spoke with patient's husband re: above and he was pleased with bed offer. He does not feel that he can manage her care any longer CSW will follow up. Lorri Frederick. [place]  [phone]

## 2013-03-13 DIAGNOSIS — R443 Hallucinations, unspecified: Secondary | ICD-10-CM

## 2013-03-13 DIAGNOSIS — F03918 Unspecified dementia, unspecified severity, with other behavioral disturbance: Secondary | ICD-10-CM

## 2013-03-13 DIAGNOSIS — F0391 Unspecified dementia with behavioral disturbance: Secondary | ICD-10-CM

## 2013-03-13 MED ORDER — RISPERIDONE 0.5 MG PO TBDP: 0.5000 mg | ORAL_TABLET | Freq: Two times a day (BID) | ORAL | Status: DC

## 2013-03-13 MED ORDER — MELOXICAM 15 MG PO TABS: 15.0000 mg | ORAL_TABLET | Freq: Every day | ORAL | Status: DC | PRN

## 2013-03-13 MED ORDER — ENTACAPONE 200 MG PO TABS: 200.0000 mg | ORAL_TABLET | Freq: Every day | ORAL | Status: DC

## 2013-03-13 MED ORDER — DIVALPROEX SODIUM 250 MG PO DR TAB
250.0000 mg | DELAYED_RELEASE_TABLET | Freq: Two times a day (BID) | ORAL | Status: DC
  Administered 2013-03-13 – 2013-03-16 (×6): 250 mg via ORAL
  Filled 2013-03-13 (×7): qty 1

## 2013-03-13 MED ORDER — HALOPERIDOL 1 MG PO TABS: 1.0000 mg | ORAL_TABLET | Freq: Four times a day (QID) | ORAL | Status: DC | PRN

## 2013-03-13 NOTE — Clinical Social Work Psychosocial (Addendum)
    Clinical Social Work Department BRIEF PSYCHOSOCIAL ASSESSMENT 03/13/2013  Patient:  [name], [name]     Account Number:  [id number]     Admit date:  03/08/2013  Clinical Social Worker:  Tiburcio Pea  Date/Time:  03/11/2013 04:55 PM  Referred by:  Physician  Date Referred:  03/11/2013 Referred for  SNF Placement   Other Referral:   Interview type:  Family Other interview type:   Husband [name].  Patient is very confused    PSYCHOSOCIAL DATA Living Status:  HUSBAND Admitted from facility:   Level of care:   Primary support name:  Brayleigh Rybacki  [phone]  (C) [phone] Primary support relationship to patient:  SPOUSE Degree of support available:   Strong    CURRENT CONCERNS Current Concerns  Other - See comment  Post-Acute Placement   Other Concerns:   Husband is exhausted and ovewhelmed with caregiving responsibilities    SOCIAL WORK ASSESSMENT / PLAN 67 year old female admitted from home where she lives with her husband [name].  Patient is alert but appears to be extremely confused; very active in bed and is a poor historian.   Patient currently has a Geneticist, molecular due to potential to get out of bed.  Husband reports that patient was in the hospital about a week ago and was encouraged at that time to place patient but he felt he needed to take her home.  He relates that he feels this was a mistake and he can not longer care for her at home.  He states that her mental confusion is steadily worsening.  Husband was very emotional during the visit and stated that he had always promised to take care of her.  He now agrees to SNF placement. CSW discussed SNF bed search and he agrees; requests placement at [facility] if possible as he is familiar with the facility and they have family member currently residing there.  Fl2 and PASARR were completed by Reece Levy, LCSWA.  SNF search has been intiaited in [facility].  Fl2 placed on chart. Bed offer has been  recieved by [facility] and can accept pateint when stable.  Patient currently requires  a Geneticist, molecular.  Will need to be without a sitter for 24 hours.  Patient has dementia.   Assessment/plan status:  Psychosocial Support/Ongoing Assessment of Needs Other assessment/ plan:   Information/referral to community resources:   SNF bed list provided to husband. He hope for [facility].    PATIENT'S/FAMILY'S RESPONSE TO PLAN OF CARE: Patient is alert but very confused. She is unable to respond appropiatly to plan of care.  Patient's husband is very pleasant but appears overhwelmed and saddened by his wife's continued mental decline.  He is very attentive to her needs and extremely supportive. He was appreciative of CSW's visit and assistance with placement.  Lorri Frederick. Burgandy Hackworth, LCSWA [phone]

## 2013-03-13 NOTE — Clinical Social Work Placement (Addendum)
    Clinical Social Work Department CLINICAL SOCIAL WORK PLACEMENT NOTE 03/13/2013  Patient:  [name], [name]  Account Number:  [id number] Admit date:  03/08/2013  Clinical Social Worker:  Lupita Leash Havana Baldwin, LCSWA  Date/time:  03/12/2013 04:45 PM  Clinical Social Work is seeking post-discharge placement for this patient at the following level of care:   SKILLED NURSING   (*CSW will update this form in Epic as items are completed)   03/12/2013  Patient/family provided with [facility] Department of Clinical Social Work's list of facilities offering this level of care within the geographic area requested by the patient (or if unable, by the patient's family).  03/12/2013  Patient/family informed of their freedom to choose among providers that offer the needed level of care, that participate in Medicare, Medicaid or managed care program needed by the patient, have an available bed and are willing to accept the patient.  03/12/2013  Patient/family informed of [facility]' ownership interest in [facility], as well as of the fact that they are under no obligation to receive care at this facility.  PASARR submitted to EDS on 03/10/2013 PASARR number received from EDS on 03/10/2013  FL2 transmitted to all facilities in geographic area requested by pt/family on  03/12/2013 FL2 transmitted to all facilities within larger geographic area on   Patient informed that his/her managed care company has contracts with or will negotiate with  certain facilities, including the following:   [facility]     Patient/family informed of bed offers received:  03/12/2013 Patient chooses bed at [facility] Physician recommends and patient chooses bed at    Patient to be transferred to  on   Patient to be transferred to facility by Ambulance  Sharin Mons)  The following physician request were entered in Epic:   Additional Comments: 03/12/13  Still have saftey sitter. SNF will accept Sat 03/13/13  if medically stable and sitter free.

## 2013-03-13 NOTE — Discharge Summary (Deleted)
Physician Discharge Summary  [name] AVW:[id number] DOB: [birth year] DOA: 03/08/2013  PCP: Aida Puffer, MD  Admit date: 03/08/2013 Discharge date: 03/13/2013  Time spent: > 35 minutes  Recommendations for Outpatient Follow-up:  1. Continue to monitor blood pressures and adjust antihypertensive medication as needed.  Discharge Diagnoses:  Principal Problem:  Dementia with psychosis and behavioral problems Active Problems:   Parkinson disease   Dementia   Dementia with psychosis HTN Dehydration Chronic recurrent UTI's   Discharge Condition: stable  Diet recommendation: Low sodium heart healthy  Filed Weights   03/09/13 0113  Weight: 45.859 kg (101 lb 1.6 oz)    History of present illness:  67 y/o with h/o Parkinson's disease and advancing dementia that presented to the ED after complaints of hallucinations and difficulty caring for patient at home due to advancing dementia.  Hospital Course:  1. Dementia with psychosis and behavioral problems, parkinson's disease with delirium; neuro exam non focal; CT head: no acute findings; no s/s of infection; UA , CXR unremarkable;  -per husband: patient has been feeling increasingly difficult to manage patient at home. Palliative care meeting pending for GOC on 03/10/13 and recommended the following: 1. DO NOT RESUSCITATE, [place] on chart  2. Dementia with behavior continue her current medications with the addition of risperidone.  3. Comfort feed  Disposition to the floor medically stable  - Psychiatry consulted for disposition plans. At this juncture plan is to place in skilled nursing facility. After their evaluation they recommended the following: Start Risperidone 0.5 mg PO BID  Continue Haldol IV PRN while in hospital  Patient does not meet criteria for capacity  Will refer to Social services regarding MCPOA  Recommend SNF as patient husband wishes to be placed out of home   2. Mild dehydration - improved with  gentle fluid hydration. 3. Chronic recurrent UTI - on suppressive antibiotic as outpatient. Due to decreased creatinine clearance to 40 on my calculation discontinued nitrofurantoin. 4. Hypertension - Continue home medications. Will continue to monitor 5. Right middle finger laceration - tetanus toxoid ordered per prior PN 6. Anemia chronic: stable with no active bleeding reported.  Procedures:  None  Consultations:  Psychiatry  Palliative care  Discharge Exam: Filed Vitals:   03/13/13 0214  BP: 186/79  Pulse: 64  Temp: 97.4 F (36.3 C)  Resp: 16    General: Pt in NAD, Alert and Awake Cardiovascular: RRR, no MRG Respiratory: CTA BL, no wheezes  Discharge Instructions  Discharge Orders   Future Appointments Provider Department Dept Phone   07/06/2013 2:30 PM York Spaniel, MD [facility] [phone]   Future Orders Complete By Expires   Call MD for:  redness, tenderness, or signs of infection (pain, swelling, redness, odor or green/yellow discharge around incision site)  As directed    Call MD for:  temperature >100.4  As directed    Diet - low sodium heart healthy  As directed    Increase activity slowly  As directed        Medication List    STOP taking these medications       ALPRAZolam 1 MG tablet  Commonly known as:  XANAX     carbidopa-levodopa-entacapone 31.25-125-200 MG per tablet  Commonly known as:  STALEVO     diphenhydrAMINE 25 MG tablet  Commonly known as:  BENADRYL     nitrofurantoin 50 MG capsule  Commonly known as:  MACRODANTIN     sulfamethoxazole-trimethoprim 800-160 MG per tablet  Commonly known  as:  BACTRIM DS,SEPTRA DS      TAKE these medications       acetaminophen 500 MG tablet  Commonly known as:  TYLENOL  Take 500-1,000 mg by mouth every 6 (six) hours as needed for pain.     amantadine 100 MG capsule  Commonly known as:  SYMMETREL  Take 100 mg by mouth 2 (two) times daily.     amLODipine 2.5 MG  tablet  Commonly known as:  NORVASC  Take 2.5 mg by mouth daily.     cloNIDine 0.1 MG tablet  Commonly known as:  CATAPRES  Take 0.1 mg by mouth daily.     entacapone 200 MG tablet  Commonly known as:  COMTAN  Take 1 tablet (200 mg total) by mouth daily with breakfast.     ferrous sulfate 325 (65 FE) MG EC tablet  Take 325 mg by mouth 2 (two) times daily.     haloperidol 1 MG tablet  Commonly known as:  HALDOL  Take 1 tablet (1 mg total) by mouth every 6 (six) hours as needed.     meloxicam 15 MG tablet  Commonly known as:  MOBIC  Take 1 tablet (15 mg total) by mouth daily as needed for pain.     metoprolol tartrate 25 MG tablet  Commonly known as:  LOPRESSOR  Take 25 mg by mouth 2 (two) times daily.     omeprazole 20 MG capsule  Commonly known as:  PRILOSEC  Take 20 mg by mouth daily.     polyethylene glycol packet  Commonly known as:  MIRALAX / GLYCOLAX  Take 17 g by mouth daily.     risperiDONE 0.5 MG disintegrating tablet  Commonly known as:  RISPERDAL M-TABS  Take 1 tablet (0.5 mg total) by mouth 2 (two) times daily.       Allergies  Allergen Reactions  . Estrogens Hives and Swelling  . Tramadol Other (See Comments)    Unsure of reaction per patient  . Xanax [Alprazolam]     Worsening confusion  . Hydromorphone Rash  . Neurontin [Gabapentin] Other (See Comments)    Urinary problems  . Olanzapine Itching and Rash      The results of significant diagnostics from this hospitalization (including imaging, microbiology, ancillary and laboratory) are listed below for reference.    Significant Diagnostic Studies: Dg Chest 2 View  03/08/2013   CLINICAL DATA:  Hallucination and altered mental status.  EXAM: CHEST  2 VIEW  COMPARISON:  10/17/2011  FINDINGS: Limited lateral projection due to patient positioning.  Mild streaky lower lung opacities, likely scarring or atelectasis given morphology. No frank consolidation or edema. No effusion or pneumothorax. Normal  heart size.  Diffuse degenerative endplate spurring.  IMPRESSION: Probable mild atelectasis at the bases. No edema or consolidationsuspected .   Electronically Signed   By: Tiburcio Pea M.D.   On: 03/08/2013 23:18   Ct Head Wo Contrast  03/08/2013   CLINICAL DATA:  Hallucinations with loss of consciousness  EXAM: CT HEAD WITHOUT CONTRAST  TECHNIQUE: Contiguous axial images were obtained from the base of the skull through the vertex without intravenous contrast.  COMPARISON:  02/01/2013  FINDINGS: Motion degraded examination, mild/moderate.  Skull:No acute osseous abnormality. No lytic or blastic lesion.  Orbits: No acute abnormality.  Brain: No evidence of acute abnormality, such as acute infarction, hemorrhage, hydrocephalus, or mass lesion/mass effect. Similar pattern of extensive, confluent bilateral cerebral white matter low density. Mild cerebral volume loss. Extensive intracranial  atherosclerotic calcification.  IMPRESSION: 1. No evidence of acute intracranial disease. 2. Advanced chronic small vessel ischemia.   Electronically Signed   By: Tiburcio Pea M.D.   On: 03/08/2013 22:58    Microbiology: Recent Results (from the past 240 hour(s))  URINE CULTURE     Status: None   Collection Time    03/08/13 10:55 PM      Result Value Range Status   Specimen Description URINE, CATHETERIZED   Final   Special Requests NONE   Final   Culture  Setup Time     Final   Value: 03/09/2013 07:38     Performed at [facility]   [place]ony Count     Final   Value: NO GROWTH     Performed at [facility]   Culture     Final   Value: NO GROWTH     Performed at [facility]   Report Status 03/10/2013 FINAL   Final     Labs: Basic Metabolic Panel:  Recent Labs Lab 03/08/13 2039 03/11/13 0545  NA 144 145  K 4.3 3.8  CL 109 111  CO2 23 19  GLUCOSE 103* 147*  BUN 33* 27*  CREATININE 1.00 0.88  CALCIUM 9.1 9.0   Liver Function Tests:  Recent Labs Lab 03/08/13 2039   AST 17  ALT 10  ALKPHOS 104  BILITOT 0.4  PROT 6.5  ALBUMIN 3.8   No results found for this basename: LIPASE, AMYLASE,  in the last 168 hours No results found for this basename: AMMONIA,  in the last 168 hours CBC:  Recent Labs Lab 03/08/13 2039  WBC 7.6  NEUTROABS 4.9  HGB 10.2*  HCT 28.8*  MCV 98.0  PLT 124*   Cardiac Enzymes: No results found for this basename: CKTOTAL, CKMB, CKMBINDEX, TROPONINI,  in the last 168 hours BNP: BNP (last 3 results) No results found for this basename: PROBNP,  in the last 8760 hours CBG: No results found for this basename: GLUCAP,  in the last 168 hours     Signed:  [name]  [facility] 03/13/2013, 10:56 AM

## 2013-03-13 NOTE — Progress Notes (Signed)
Clinical Child psychotherapist (CSW) received referral to assist with D/C today. Per MD patient is not D/C today or over the weekend. Weekday CSW will follow up.   Jetta Lout, LCSWA Weekend CSW [phone]

## 2013-03-13 NOTE — Progress Notes (Signed)
[facility] PROGRESS NOTE  [name] ZOX:[id number] DOB: 1946-03-23 DOA: 03/08/2013 PCP: Aida Puffer, MD  Assessment/Plan: Dementia with psychosis and behavioral problems, parkinson's disease with delirium; neuro exam non focal; CT head: no acute findings; no s/s of infection; UA , CXR unremarkable;  -per husband: patient has been feeling increasingly difficult to manage patient at home. Palliative care meeting pending for GOC on 03/10/13. Psychiatry subsequently consult for disposition plans. At this juncture plan is to place in skilled nursing facility - please refer to psychiatry recommended risperdone but patient today is less responsive and having difficulty eating. Discussed with Palliative care team that recommends Neurology consult for optimization in symptom management given her history of Parkinson's.    Of note patient has had difficulty with some medication and per prior notes, " per family did not do well in the past with Seroquel which had an opposite effect, zyprexa which gave her hive, and xanax which caused her to be like a "drunk man."  2. Mild dehydration - improved with gentle fluid hydration. 3. Chronic recurrent UTI - on suppressive antibiotic as outpatient. Due to decreased creatinine clearance to 40 on my calculation discontinued nitrofurantoin. 4. Hypertension - Continue home medications. Will continue to monitor 5. Right middle finger laceration - tetanus toxoid ordered per prior PN 6. Anemia chronic: stable with no active bleeding reported.  Code Status: DNR Family Communication: d/c spouse at bedside Disposition Plan: SNF vs hospice if no improvement in condition.   Consultants:  Palliative care  Procedures:  CT head: no evidence of acute intracranial disease.  Antibiotics:  Nitrofurantoin>>>d/c 03/10/13  HPI/Subjective: No new complaints. No acute issues reported overnight. Awaiting psychiatry consult. Patient is more alert but husband  reports that she is still hallucinating  Objective: Filed Vitals:   03/13/13 1147  BP: 163/97  Pulse: 70  Temp:   Resp:     Intake/Output Summary (Last 24 hours) at 03/13/13 1153 Last data filed at 03/12/13 1700  Gross per 24 hour  Intake    600 ml  Output      0 ml  Net    600 ml   Filed Weights   03/09/13 0113  Weight: 45.859 kg (101 lb 1.6 oz)    Exam:   General:  Pt in NAD, alert and awake  Cardiovascular: RRR, no MRG  Respiratory: CTA BL, no wheezes  Abdomen: soft, NT  Musculoskeletal: no clubbing.   Data Reviewed: Basic Metabolic Panel:  Recent Labs Lab 03/08/13 2039 03/11/13 0545  NA 144 145  K 4.3 3.8  CL 109 111  CO2 23 19  GLUCOSE 103* 147*  BUN 33* 27*  CREATININE 1.00 0.88  CALCIUM 9.1 9.0   Liver Function Tests:  Recent Labs Lab 03/08/13 2039  AST 17  ALT 10  ALKPHOS 104  BILITOT 0.4  PROT 6.5  ALBUMIN 3.8   No results found for this basename: LIPASE, AMYLASE,  in the last 168 hours No results found for this basename: AMMONIA,  in the last 168 hours CBC:  Recent Labs Lab 03/08/13 2039  WBC 7.6  NEUTROABS 4.9  HGB 10.2*  HCT 28.8*  MCV 98.0  PLT 124*   Cardiac Enzymes: No results found for this basename: CKTOTAL, CKMB, CKMBINDEX, TROPONINI,  in the last 168 hours BNP (last 3 results) No results found for this basename: PROBNP,  in the last 8760 hours CBG: No results found for this basename: GLUCAP,  in the last 168 hours  Recent Results (from the  past 240 hour(s))  URINE CULTURE     Status: None   Collection Time    03/08/13 10:55 PM      Result Value Range Status   Specimen Description URINE, CATHETERIZED   Final   Special Requests NONE   Final   Culture  Setup Time     Final   Value: 03/09/2013 07:38     Performed at [employer] [facility] Count     Final   Value: NO GROWTH     Performed at [facility]   Culture     Final   Value: NO GROWTH     Performed at [facility]   Report  Status 03/10/2013 FINAL   Final     Studies: No results found.  Scheduled Meds: . amantadine  100 mg Oral BID  . amLODipine  2.5 mg Oral Daily  . carbidopa-levodopa  0.5 tablet Oral Custom   And  . entacapone  100 mg Oral Custom  . cloNIDine  0.1 mg Oral Daily  . enoxaparin (LOVENOX) injection  30 mg Subcutaneous Q24H  . entacapone  200 mg Oral Q breakfast  . ferrous sulfate  325 mg Oral BID  . metoprolol tartrate  25 mg Oral BID  . pantoprazole  40 mg Oral Daily  . polyethylene glycol  17 g Oral Daily  . TDaP  0.5 mL Intramuscular Once   Continuous Infusions:   Principal Problem:   Delirium Active Problems:   Parkinson disease   Dementia   Dementia with psychosis    Time spent: > 35 minutes    [name]  [facility] Pager [phone] If 7PM-7AM, please contact night-coverage at [email or web address], password [email or web address] 03/13/2013, 11:53 AM  LOS: 5 days

## 2013-03-13 NOTE — Progress Notes (Signed)
Patient ZO:XWRUE B Silverman      DOB: 05-02-1946      AVW:[id number]   Palliative Medicine Team at [facility] Progress Note    Subjective:patient noted to be awake but not able to respond. Discussed case with dr. Cena Benton and Dr. Anne Hahn. Patient not tolerating risperdal.  Spouse expresses that if we are unable to control her symptoms would like to consider hospice stay instead of SNF placement. Recommend official neuro consult. Will follow with you, may need residential placement.     Filed Vitals:   03/13/13 1147  BP: 163/97  Pulse: 70  Temp:   Resp:    Physical exam:  General: not able to give a motor response. Eyes open.  Chest clear cvs : regular Ext: warm,  Neuro: exacerbation of parkinsonism, motor freez     Assessment and plan:very unfortunate white female with severe dementia with hallucinations. Very sensitive to medication to keep her safe and calm. risperone causing complications.   1. DNR  2.Stop risperidone, consider depakote. See neuro recommendations    3. Disposition. If able to release patient from current exacerbation of motor dysfunction and titrate meds could go to nursing home. If unable to get her settled spouse talking about hospice home placement.     Total time: 25 min   Laquonda Welby L. Ladona Ridgel, MD MBA The Palliative Medicine Team at [facility] Phone: [phone] Pager: [phone]

## 2013-03-13 NOTE — Consult Note (Signed)
Reason for Consult: Hallucinations Referring Physician:  Dr York Spaniel  CC: Hallucinations  HPI: [name] is a 67 y.o. female patient followed by Dr. Lesia Sago for dementia and Parkinson's disease. The majority of today's history was obtained from the patient's husband and the chart. The patient is somewhat confused and agitated at this time. She was not able to provide any history and was uncooperative with a any type of exam. Dr. Anne Hahn stopped in to the patient earlier today at Dr. Lubertha Basque request. This was not an official consult but a social visit; however, he did recommend some medication changes.  The patient had similar hallucinations in September of this year which required hospital admission. At that time it was believed that her hallucinations were secondary to [facility] therapy. It seemed that the patient was taking more than the prescribed dosage. This was explained to the patient and her husband and arrangements were made to discharge the patient. She apparently did well initially; however, the hallucinations eventually returned. The patient's husband reports that she has been having both visual and auditory hallucinations and at times would be uncooperative and even agitated. The patient was readmitted to [facility] on 03/09/2013 for further evaluation and treatment of her hallucinations.  Today the patient received Risperdal and according to the patient's husband she was unresponsive for a significant period of time. Dr. Anne Hahn recommended a trial of Depakote as well as discontinuation of the Symmetrel. I also spoke with Dr. Roseanne Reno who has agreed with this plan and also recommended discontinuing Haldol. The Risperdal had already been stopped.    Past Medical History  Diagnosis Date  . Spondylolisthesis   . Lumbar stenosis   . Depression   . Parkinson disease   . Asthma   . Hemangioma     left posterior  . Hypertension   . CVA (cerebrovascular accident)     pts lawyer stold her  she had a stroke  . Sleep apnea     does not use a cpap machine  . Blood transfusion 1976  . Chronic UTI   . Hepatitis 1973    hepatitis A ?  Marland Kitchen GERD (gastroesophageal reflux disease)   . Anxiety   . Lumbar radiculopathy     parkinsons  . Memory disorder   . Gait disorder   . Obstructive sleep apnea   . History of seizures   . Gout   . Organic heart disease, NYHA class 1   . History of frequent urinary tract infections   . Chronic back pain   . Hearing deficit     Past Surgical History  Procedure Laterality Date  . Laminectomy    . Cardiac catheterization  2003     Angiographically patent coronary arteries with luminal irregularities in mid left anterior descending coronary artery and mid right coronary  artery. Normal left ventricular systolic function, ejection fraction estimate of  60 to 65% Normal abdominal aorta.   Normal renal arteries.  . Abdominal hysterectomy    . Foot surgery    . Anal fissure surgery    . Appendectomy    . Back surgery    . Lumbar fusion  10/23/2011    Family History  Problem Relation Age of Onset  . Coronary artery disease    . Anesthesia problems Neg Hx   . Cancer Mother   . Kidney cancer Mother   . Heart disease Mother   . Depression Mother   . Heart disease Father   . Brain cancer Sister   .  Depression Sister   . Diabetes Brother   . Diabetes Sister   . Depression Other   . Depression Son     Social History:  reports that she has quit smoking. She has never used smokeless tobacco. She reports that she does not drink alcohol or use illicit drugs.  Allergies  Allergen Reactions  . Estrogens Hives and Swelling  . Tramadol Other (See Comments)    Unsure of reaction per patient  . Xanax [Alprazolam]     Worsening confusion  . Hydromorphone Rash  . Neurontin [Gabapentin] Other (See Comments)    Urinary problems  . Olanzapine Itching and Rash    Medications:  Scheduled: . amLODipine  2.5 mg Oral Daily  . carbidopa-levodopa   0.5 tablet Oral Custom   And  . entacapone  100 mg Oral Custom  . cloNIDine  0.1 mg Oral Daily  . divalproex  250 mg Oral Q12H  . enoxaparin (LOVENOX) injection  30 mg Subcutaneous Q24H  . entacapone  200 mg Oral Q breakfast  . ferrous sulfate  325 mg Oral BID  . metoprolol tartrate  25 mg Oral BID  . pantoprazole  40 mg Oral Daily  . polyethylene glycol  17 g Oral Daily  . TDaP  0.5 mL Intramuscular Once    ROS: Unobtainable at this time secondary to the patient's agitation and confusion.  Physical Examination: Blood pressure 163/97, pulse 70, temperature 97.9 F (36.6 C), temperature source Oral, resp. rate 20, height 5' (1.524 m), weight 45.859 kg (101 lb 1.6 oz), SpO2 99.00%.  Neurologic Examination The patient could not be examined at this time due to agitation. She did respond to some orientation questions. She knew that she was in [place] [facility] of she did not know the date.  Laboratory Studies:   Basic Metabolic Panel:  Recent Labs Lab 03/08/13 2039 03/11/13 0545  NA 144 145  K 4.3 3.8  CL 109 111  CO2 23 19  GLUCOSE 103* 147*  BUN 33* 27*  CREATININE 1.00 0.88  CALCIUM 9.1 9.0    Liver Function Tests:  Recent Labs Lab 03/08/13 2039  AST 17  ALT 10  ALKPHOS 104  BILITOT 0.4  PROT 6.5  ALBUMIN 3.8   No results found for this basename: LIPASE, AMYLASE,  in the last 168 hours No results found for this basename: AMMONIA,  in the last 168 hours  CBC:  Recent Labs Lab 03/08/13 2039  WBC 7.6  NEUTROABS 4.9  HGB 10.2*  HCT 28.8*  MCV 98.0  PLT 124*    Cardiac Enzymes: No results found for this basename: CKTOTAL, CKMB, CKMBINDEX, TROPONINI,  in the last 168 hours  BNP: No components found with this basename: POCBNP,   CBG: No results found for this basename: GLUCAP,  in the last 168 hours  Microbiology: Results for orders placed during the hospital encounter of 03/08/13  URINE CULTURE     Status: None   Collection Time     03/08/13 10:55 PM      Result Value Range Status   Specimen Description URINE, CATHETERIZED   Final   Special Requests NONE   Final   Culture  Setup Time     Final   Value: 03/09/2013 07:38     Performed at [employer] [facility] Count     Final   Value: NO GROWTH     Performed at [employer] [facility]  Final   Value: NO GROWTH     Performed at [facility]   Report Status 03/10/2013 FINAL   Final    Coagulation Studies: No results found for this basename: LABPROT, INR,  in the last 72 hours  Urinalysis:   Recent Labs Lab 03/08/13 2255  COLORURINE AMBER*  LABSPEC 1.017  PHURINE 7.0  GLUCOSEU NEGATIVE  HGBUR NEGATIVE  BILIRUBINUR NEGATIVE  KETONESUR NEGATIVE  PROTEINUR 100*  UROBILINOGEN 1.0  NITRITE NEGATIVE  LEUKOCYTESUR NEGATIVE    Lipid Panel:  No results found for this basename: chol,  trig,  hdl,  cholhdl,  vldl,  ldlcalc    HgbA1C:  No results found for this basename: HGBA1C    Urine Drug Screen:   No results found for this basename: labopia,  cocainscrnur,  labbenz,  amphetmu,  thcu,  labbarb    Alcohol Level: No results found for this basename: ETH,  in the last 168 hours  Other results: EKG: (Preliminary reading) - Atrial fibrillation rate 83 beats per minute ( 03/08/2013 )   Imaging:  Head CT 03/08/2013 1. No evidence of acute intracranial disease.  2. Advanced chronic small vessel ischemia.   Assessment/Plan:  Unfortunate 67 year old female with a history of Parkinson's disease, dementia, seizures, and a previous CVA. Recently she has developed auditory and visual hallucinations the cause of which are uncertain.   Recommend a trial of Depakote 250 mg every 12 hours.  We will continue to follow this patient with you.  Delton See PA-C [facility] Pager [phone]  I personally participated in this patient's evaluation and management, including formulating the above clinical  impression and management recommendations.  Venetia Maxon M.D. [facility] [phone] 03/13/2013, 2:23 PM

## 2013-03-13 NOTE — Progress Notes (Signed)
Came by the room today for a social visit., And discussed the case with the patient's husband. The patient has had progressively worsening issues with hallucinations. The husband indicates that she is having visual and auditory hallucinations, associated with agitation.  The patient could not tolerate Seroquel, is allergic to Zyprexa. Would recommend the use of low-dose Abilify, the patient cannot take Risperdal and should not be on Haldol.  Visual and auditory hallucinations or unusual for Parkinson's disease as a medication side effect. The patient likely has underlying dementia, but she may have psychosis or a psychotic depression.  I would discontinue the Symmetrel.  Use of low-dose Depakote is low risk, okay to try this medication. Consider use of an SSRI antidepressant in combination with the Abilify.  Thank you for the inpatient assistance with this very difficult patient.

## 2013-03-14 NOTE — Progress Notes (Signed)
[facility] PROGRESS NOTE  AFRA TRICARICO JWJ:[id number] DOB: 1946-03-28 DOA: 03/08/2013 PCP: Aida Puffer, MD  Assessment/Plan: Dementia with psychosis and behavioral problems, parkinson's disease with delirium; neuro exam non focal; CT head: no acute findings; no s/s of infection; UA , CXR unremarkable;  -per husband: patient has been feeling increasingly difficult to manage patient at home. Palliative care meeting pending for GOC on 03/10/13. Psychiatry subsequently consult for disposition plans and risperdal added.  Due to dystonia riperdal was discontinued and Neurology consulted. At this juncture plan is to place in skilled nursing facility - please refer to psychiatry recommended risperdone but patient today is less responsive and having difficulty eating. Discussed with Palliative care team that recommends Neurology consult for optimization in symptom management given her history of Parkinson's.    Of note patient has had difficulty with some medication and per prior notes, " per family did not do well in the past with Seroquel which had an opposite effect, zyprexa which gave her hive, and xanax which caused her to be like a "drunk man."  2. Mild dehydration - improved with gentle fluid hydration. 3. Chronic recurrent UTI - on suppressive antibiotic as outpatient. Due to decreased creatinine clearance to 40 on my calculation discontinued nitrofurantoin. 4. Hypertension - Continue home medications. Will continue to monitor 5. Right middle finger laceration - tetanus toxoid ordered per prior PN 6. Anemia chronic: stable with no active bleeding reported.  Code Status: DNR Family Communication: d/c spouse at bedside Disposition Plan: SNF vs hospice if no improvement in condition.   Consultants:  Palliative care  Neurology  Psychiatry  Procedures:  CT head: no evidence of acute intracranial disease.  Antibiotics:  Nitrofurantoin>>>d/c 03/10/13  HPI/Subjective: Husband  reports that patient's alertness is improved from yesterday but still sleepy.  Objective: Filed Vitals:   03/14/13 0558  BP: 178/95  Pulse: 78  Temp: 98.7 F (37.1 C)  Resp: 18   No intake or output data in the 24 hours ending 03/14/13 1202 Filed Weights   03/09/13 0113  Weight: 45.859 kg (101 lb 1.6 oz)    Exam:   General:  Pt in NAD, alert and awake  Cardiovascular: RRR, no MRG  Respiratory: CTA BL, no wheezes  Abdomen: soft, NT  Musculoskeletal: no clubbing.   Data Reviewed: Basic Metabolic Panel:  Recent Labs Lab 03/08/13 2039 03/11/13 0545  NA 144 145  K 4.3 3.8  CL 109 111  CO2 23 19  GLUCOSE 103* 147*  BUN 33* 27*  CREATININE 1.00 0.88  CALCIUM 9.1 9.0   Liver Function Tests:  Recent Labs Lab 03/08/13 2039  AST 17  ALT 10  ALKPHOS 104  BILITOT 0.4  PROT 6.5  ALBUMIN 3.8   No results found for this basename: LIPASE, AMYLASE,  in the last 168 hours No results found for this basename: AMMONIA,  in the last 168 hours CBC:  Recent Labs Lab 03/08/13 2039  WBC 7.6  NEUTROABS 4.9  HGB 10.2*  HCT 28.8*  MCV 98.0  PLT 124*   Cardiac Enzymes: No results found for this basename: CKTOTAL, CKMB, CKMBINDEX, TROPONINI,  in the last 168 hours BNP (last 3 results) No results found for this basename: PROBNP,  in the last 8760 hours CBG: No results found for this basename: GLUCAP,  in the last 168 hours  Recent Results (from the past 240 hour(s))  URINE CULTURE     Status: None   Collection Time    03/08/13 10:55 PM  Result Value Range Status   Specimen Description URINE, CATHETERIZED   Final   Special Requests NONE   Final   Culture  Setup Time     Final   Value: 03/09/2013 07:38     Performed at [facility]   [facility] Count     Final   Value: NO GROWTH     Performed at [facility]   Culture     Final   Value: NO GROWTH     Performed at [facility]   Report Status 03/10/2013 FINAL   Final      Studies: No results found.  Scheduled Meds: . amLODipine  2.5 mg Oral Daily  . carbidopa-levodopa  0.5 tablet Oral Custom   And  . entacapone  100 mg Oral Custom  . cloNIDine  0.1 mg Oral Daily  . divalproex  250 mg Oral Q12H  . enoxaparin (LOVENOX) injection  30 mg Subcutaneous Q24H  . entacapone  200 mg Oral Q breakfast  . ferrous sulfate  325 mg Oral BID  . metoprolol tartrate  25 mg Oral BID  . pantoprazole  40 mg Oral Daily  . polyethylene glycol  17 g Oral Daily  . TDaP  0.5 mL Intramuscular Once   Continuous Infusions:   Principal Problem:   Dementia with psychosis Active Problems:   Delirium   Parkinson disease   Dementia    Time spent: > 35 minutes    [name]  [facility] Pager [phone] If 7PM-7AM, please contact night-coverage at [email or web address], password [facility] 03/14/2013, 12:02 PM  LOS: 6 days

## 2013-03-14 NOTE — Progress Notes (Signed)
Patient ZO:XWRUE SARHA BARTELT      DOB: [birth year]      AVW:[id number]   Palliative Medicine Team at [facility] Progress Note    Subjective: Per patient's spouse she had been up and somewhat feisty earlier in the day but is now back in bed with symptoms of muscular freeze. She did not eat much today. Family would like me to request evaluation for residential hospice placement. The patient's spouse would still like treatment of her current illnesses but is considering comfort measures     Filed Vitals:   03/14/13 1435  BP: 188/68  Pulse:   Temp:   Resp:    Physical exam:  Gen. eyes open, minimal blinking Pupils equal reactive to light extraocular muscles appear to be intact to the best of my exam in that she will follow in my move Chest is decreased but clear to auscultation  Cardiovascular regular rate and rhythm assessment and S2 Abdomen soft Strategies warm without mottling Neurologically: Rigidity, not speaking again    Assessment and plan: 67 year old white female with severe Parkinson's disease is very sensitive to medications admitted with worsening altered mental status. Patient has had adverse responses to multiple medications for her hallucinations and delusions. At present she has stopped eating and is not being cooperative with the taking her spouse would like me to consider residential hospice placement   1 DO NOT RESUSCITATE DO NOT INTUBATE 2. Depakote for dementia with behavior 3. Parkinsonism being exacerbated by the patient not taking her medication 4. We'll place a social work consult for residential hospice placement in the morning Family is not quite set on full comfort but want to pursue their options. Likely will adopt full comfort approach after speaking with hospice.   25 minutes Myalee Stengel L. Ladona Ridgel, MD MBA The Palliative Medicine Team at [facility] Phone: [phone] Pager: [phone]

## 2013-03-15 DIAGNOSIS — E43 Unspecified severe protein-calorie malnutrition: Secondary | ICD-10-CM

## 2013-03-15 MED ORDER — CARBIDOPA-LEVODOPA 25-100 MG PO TABS
1.0000 | ORAL_TABLET | ORAL | Status: DC
  Administered 2013-03-16: 1 via ORAL
  Filled 2013-03-15 (×3): qty 1

## 2013-03-15 MED ORDER — ENTACAPONE 200 MG PO TABS
100.0000 mg | ORAL_TABLET | ORAL | Status: DC
  Administered 2013-03-16 (×2): 100 mg via ORAL
  Filled 2013-03-15 (×2): qty 0.5

## 2013-03-15 MED ORDER — ENTACAPONE 200 MG PO TABS
200.0000 mg | ORAL_TABLET | ORAL | Status: DC
  Administered 2013-03-16: 200 mg via ORAL
  Filled 2013-03-15 (×2): qty 1

## 2013-03-15 MED ORDER — THERA-DERM EX LOTN
TOPICAL_LOTION | CUTANEOUS | Status: DC | PRN
  Filled 2013-03-15: qty 236

## 2013-03-15 MED ORDER — CETAPHIL MOISTURIZING EX LOTN
TOPICAL_LOTION | CUTANEOUS | Status: DC | PRN
  Administered 2013-03-15: 14:00:00 via TOPICAL
  Filled 2013-03-15: qty 473

## 2013-03-15 MED ORDER — POLYVINYL ALCOHOL 1.4 % OP SOLN
1.0000 [drp] | OPHTHALMIC | Status: DC | PRN
  Administered 2013-03-15: 1 [drp] via OPHTHALMIC
  Filled 2013-03-15: qty 15

## 2013-03-15 MED ORDER — CARBIDOPA-LEVODOPA 25-100 MG PO TABS
0.5000 | ORAL_TABLET | ORAL | Status: DC
  Administered 2013-03-16 (×2): 0.5 via ORAL
  Filled 2013-03-15 (×2): qty 0.5

## 2013-03-15 NOTE — Progress Notes (Signed)
[facility] PROGRESS NOTE  [name] ZOX:[id number] DOB: June 24, 1945 DOA: 03/08/2013 PCP: Aida Puffer, MD  Assessment/Plan: Dementia with psychosis and behavioral problems, parkinson's disease with  - Please refer to prior note regarding Neurology's recommendations as well as psychiatry's recommendations. Palliative on board and recommending after discussion with family the following: 1 DO NOT RESUSCITATE DO NOT INTUBATE  2. Depakote for dementia with behavior  3. Parkinsonism being exacerbated by the patient not taking her medication  4. We'll place a social work consult for residential hospice placement in the morning  Family is not quite set on full comfort but want to pursue their options. Likely will adopt full comfort approach after speaking with hospice  2. Mild dehydration -with recent changes in medication as well as progressive Parkinson's dementia patient has had trouble with oral intake. Will treat supportively at this time 3. Chronic recurrent UTI -discontinued nitrofurantoin initially due to creatinine clearance of 40 4. Hypertension - Continue home medications. Will continue to monitor 5. Right middle finger laceration - tetanus toxoid ordered and placed per EMR 6. Anemia chronic: stable with no active bleeding reported.  Code Status: DNR Family Communication: d/c spouse at bedside Disposition Plan: Plan will be for placement into hospice. Social work consult place   Consultants:  Palliative care  Neurology  Psychiatry  Procedures:  CT head: no evidence of acute intracranial disease.  Antibiotics:  Nitrofurantoin>>>d/c 03/10/13  HPI/Subjective: Husband reports that patient's alertness is improved from yesterday but still sleepy. No new problems reported. Patient still having difficulty with oral intake  Objective: Filed Vitals:   03/15/13 1415  BP: 144/62  Pulse: 69  Temp: 99 F (37.2 C)  Resp: 17    Intake/Output Summary (Last 24 hours)  at 03/15/13 1617 Last data filed at 03/15/13 1145  Gross per 24 hour  Intake    440 ml  Output    100 ml  Net    340 ml   Filed Weights   03/09/13 0113  Weight: 45.859 kg (101 lb 1.6 oz)    Exam:   General:  Pt in NAD, alert and awake  Cardiovascular: RRR, no MRG  Respiratory: CTA BL, no wheezes  Abdomen: soft, NT  Musculoskeletal: no clubbing.   Data Reviewed: Basic Metabolic Panel:  Recent Labs Lab 03/08/13 2039 03/11/13 0545  NA 144 145  K 4.3 3.8  CL 109 111  CO2 23 19  GLUCOSE 103* 147*  BUN 33* 27*  CREATININE 1.00 0.88  CALCIUM 9.1 9.0   Liver Function Tests:  Recent Labs Lab 03/08/13 2039  AST 17  ALT 10  ALKPHOS 104  BILITOT 0.4  PROT 6.5  ALBUMIN 3.8   No results found for this basename: LIPASE, AMYLASE,  in the last 168 hours No results found for this basename: AMMONIA,  in the last 168 hours CBC:  Recent Labs Lab 03/08/13 2039  WBC 7.6  NEUTROABS 4.9  HGB 10.2*  HCT 28.8*  MCV 98.0  PLT 124*   Cardiac Enzymes: No results found for this basename: CKTOTAL, CKMB, CKMBINDEX, TROPONINI,  in the last 168 hours BNP (last 3 results) No results found for this basename: PROBNP,  in the last 8760 hours CBG: No results found for this basename: GLUCAP,  in the last 168 hours  Recent Results (from the past 240 hour(s))  URINE CULTURE     Status: None   Collection Time    03/08/13 10:55 PM      Result Value Range Status  Specimen Description URINE, CATHETERIZED   Final   Special Requests NONE   Final   Culture  Setup Time     Final   Value: 03/09/2013 07:38     Performed at [facility]   [facility] Count     Final   Value: NO GROWTH     Performed at [facility]   Culture     Final   Value: NO GROWTH     Performed at [facility]   Report Status 03/10/2013 FINAL   Final     Studies: No results found.  Scheduled Meds: . amLODipine  2.5 mg Oral Daily  . carbidopa-levodopa  0.5 tablet Oral Custom   And   . entacapone  100 mg Oral Custom  . cloNIDine  0.1 mg Oral Daily  . divalproex  250 mg Oral Q12H  . enoxaparin (LOVENOX) injection  30 mg Subcutaneous Q24H  . entacapone  200 mg Oral Q breakfast  . ferrous sulfate  325 mg Oral BID  . metoprolol tartrate  25 mg Oral BID  . pantoprazole  40 mg Oral Daily  . polyethylene glycol  17 g Oral Daily  . TDaP  0.5 mL Intramuscular Once   Continuous Infusions:   Principal Problem:   Dementia with psychosis Active Problems:   Delirium   Parkinson disease   Dementia    Time spent: > 35 minutes    [name]  [facility] Pager [phone] If 7PM-7AM, please contact night-coverage at [email or web address], password [facility] 03/15/2013, 4:17 PM  LOS: 7 days

## 2013-03-15 NOTE — Progress Notes (Signed)
NEURO HOSPITALIST PROGRESS NOTE   SUBJECTIVE:                                                                                                                        Patient has been much calmer over the last 24 hours.  She is refusing to take some medications and to eat. This AM, however she took all her medications. No hallucinations over night.   OBJECTIVE:                                                                                                                           Vital signs in last 24 hours: Temp:  [98.3 F (36.8 C)-100.3 F (37.9 C)] 98.3 F (36.8 C) (10/20 1013) Pulse Rate:  [65-120] 67 (10/20 1013) Resp:  [16-18] 18 (10/20 1013) BP: (131-215)/(68-110) 131/72 mmHg (10/20 1013) SpO2:  [97 %-100 %] 99 % (10/20 1013)  Intake/Output from previous day:   Intake/Output this shift:   Nutritional status: Cardiac  Past Medical History  Diagnosis Date  . Spondylolisthesis   . Lumbar stenosis   . Depression   . Parkinson disease   . Asthma   . Hemangioma     left posterior  . Hypertension   . CVA (cerebrovascular accident)     pts lawyer stold her she had a stroke  . Sleep apnea     does not use a cpap machine  . Blood transfusion 1976  . Chronic UTI   . Hepatitis 1973    hepatitis A ?  Marland Kitchen GERD (gastroesophageal reflux disease)   . Anxiety   . Lumbar radiculopathy     parkinsons  . Memory disorder   . Gait disorder   . Obstructive sleep apnea   . History of seizures   . Gout   . Organic heart disease, NYHA class 1   . History of frequent urinary tract infections   . Chronic back pain   . Hearing deficit      Neurologic Exam:   Mental Status: Alert, not oriented, minimally vocal but does answer with one word answers.  Cranial Nerves: II: Visual fields grossly normal, pupils equal, round, reactive to light and accommodation III,IV, VI: ptosis not present, extra-ocular motions intact bilaterally  V,VII: smile  symmetric, facial light touch sensation normal bilaterally VIII: hearing normal bilaterally IX,X: gag reflex present XI: bilateral shoulder shrug XII: midline tongue extension without atrophy or fasciculations  Motor: Right : Upper extremity   4/5 Distal (RTC tear proximally 0/5) Left:     Upper extremity   4/5  Lower extremity   4/5       Lower extremity   4/5 --mild increased rigidity throughout  Tone and bulk:normal tone throughout; no atrophy noted Sensory: Pinprick and light touch intact throughout, bilaterally Deep Tendon Reflexes:  Right: Upper Extremity   Left: Upper extremity   biceps (C-5 to C-6) 2/4   biceps (C-5 to C-6) 2/4 tricep (C7) 2/4    triceps (C7) 2/4 Brachioradialis (C6) 2/4  Brachioradialis (C6) 2/4  Lower Extremity Lower Extremity  0/4 bilaterally  Plantars: Right: downgoing   Left: downgoing   Lab Results: No results found for this basename: cbc, bmp, coags, chol, tri, ldl, hga1c   Lipid Panel No results found for this basename: CHOL, TRIG, HDL, CHOLHDL, VLDL, LDLCALC,  in the last 72 hours  Studies/Results: No results found.  MEDICATIONS                                                                                                                        Scheduled: . amLODipine  2.5 mg Oral Daily  . carbidopa-levodopa  0.5 tablet Oral Custom   And  . entacapone  100 mg Oral Custom  . cloNIDine  0.1 mg Oral Daily  . divalproex  250 mg Oral Q12H  . enoxaparin (LOVENOX) injection  30 mg Subcutaneous Q24H  . entacapone  200 mg Oral Q breakfast  . ferrous sulfate  325 mg Oral BID  . metoprolol tartrate  25 mg Oral BID  . pantoprazole  40 mg Oral Daily  . polyethylene glycol  17 g Oral Daily  . TDaP  0.5 mL Intramuscular Once    ASSESSMENT/PLAN:                                                                                                             Unfortunate 67 year old female with a history of Parkinson's disease, dementia, seizures,  and a previous CVA. Recently she has developed auditory and visual hallucinations the cause of which are uncertain. Since placed on Depakote 250 mg every 12 hours patient is much more calm.  Patient has been seen by palliative care and spouse would like residential hospice placement.    Recommend:  1) Continue Depakote 250  BID at time of discharge. Neurology S/O   Assessment and plan discussed with with attending physician and they are in agreement.    Felicie Morn PA-C [facility] [phone]  03/15/2013, 10:55 AM

## 2013-03-16 MED ORDER — DIVALPROEX SODIUM 250 MG PO DR TAB: 250.0000 mg | DELAYED_RELEASE_TABLET | Freq: Two times a day (BID) | ORAL | Status: DC

## 2013-03-16 NOTE — Clinical Social Work Note (Signed)
[facility] is coming today to assess patient for possible residential hospice placement at d/c.  Will advise- Reece Levy, MSW, Theresia Majors [phone]

## 2013-03-16 NOTE — Progress Notes (Signed)
Patient discharged to rehab via [facility] with husband at bedside. Instruction given to husband about follow up appointments with neurologist. RN called report to facility and give report to nursing staff. Patient discharged packet given to [facility] staff.

## 2013-03-16 NOTE — Progress Notes (Signed)
Patient was not found to be appropriate for bed at the Hospice home of [facility]- SNF bed confirmed for patient at [facility] where her sister also stays-  Husband seems comfortable with this plan- will schedule EMS transport this pm,. Reece Levy, MSW, Theresia Majors [phone]

## 2013-03-16 NOTE — Progress Notes (Signed)
Clinical Social Work Department CLINICAL SOCIAL WORK PLACEMENT NOTE 03/16/2013  Patient:  [name], [name]  Account Number:  [id number] Admit date:  03/08/2013  Clinical Social Worker:  Lupita Leash CROWDER, LCSWA  Date/time:  03/12/2013 04:45 PM  Clinical Social Work is seeking post-discharge placement for this patient at the following level of care:   SKILLED NURSING   (*CSW will update this form in Epic as items are completed)   03/12/2013  Patient/family provided with [facility] Department of Clinical Social Work's list of facilities offering this level of care within the geographic area requested by the patient (or if unable, by the patient's family).  03/12/2013  Patient/family informed of their freedom to choose among providers that offer the needed level of care, that participate in Medicare, Medicaid or managed care program needed by the patient, have an available bed and are willing to accept the patient.  03/12/2013  Patient/family informed of [facility]' ownership interest in [facility], as well as of the fact that they are under no obligation to receive care at this facility.  PASARR submitted to EDS on 03/10/2013 PASARR number received from EDS on 03/10/2013  FL2 transmitted to all facilities in geographic area requested by pt/family on  03/12/2013 FL2 transmitted to all facilities within larger geographic area on   Patient informed that his/her managed care company has contracts with or will negotiate with  certain facilities, including the following:   [facility]     Patient/family informed of bed offers received:  03/12/2013 Patient chooses bed at [facility] Physician recommends and patient chooses bed at    Patient to be transferred to [facility] on  03/16/2013 Patient to be transferred to facility by Ambulance  Sharin Mons)  The following physician request were entered in Epic:   Additional Comments: Reece Levy, MSW,  Theresia Majors [phone]

## 2013-03-16 NOTE — Progress Notes (Signed)
Nutrition Brief Note  Patient identified on the Malnutrition Screening Tool Report. Chart reviewed.  Patient's nutrition goal of care is comfort feeds. No nutrition interventions warranted at this time.  Please consult as needed.   Maureen Chatters, RD, LDN Pager #: [phone] After-Hours Pager #: [phone]

## 2013-03-16 NOTE — Progress Notes (Signed)
Patient [name]      DOB: 14-Jan-1946      JWJ:[id number]  Offered emotional support to spouse and updated primary physician regarding desire to transition to residential hospice since she continues to be symptomatic and not eating.  Will continue to support as needed.  Tinlee Navarrette L. Ladona Ridgel, MD MBA The Palliative Medicine Team at [facility] Phone: [phone] Pager: [phone]

## 2013-03-16 NOTE — Discharge Summary (Signed)
Physician Discharge Summary  [name]:[id number] DOB: 23-Jul-1945 DOA: 03/08/2013  PCP: Aida Puffer, MD  Admit date: 03/08/2013 Discharge date: 03/16/2013  Time spent: > 35 minutes  Recommendations for Outpatient Follow-up:  1. Please be sure to follow up with social services and continue plans for hospice care if condition deteriorates.  Discharge Diagnoses:  Principal Problem:   Dementia with psychosis Active Problems:   Delirium   Parkinson disease   Dementia   Discharge Condition: stable, but given neurological condition expected to continue to deteriorate.  Diet recommendation:  Low Sodium heart healthy  Filed Weights   03/09/13 0113  Weight: 45.859 kg (101 lb 1.6 oz)    History of present illness:  67 year old female with history of Parkinson's disease and advancing dementia. He presented to the ED complaining of hallucinations. Caregiver (husband) reported that patient has become increasingly more difficult to take care of at home.  Hospital Course:  Dementia with psychosis and behavioral problems, parkinson's disease  - Please refer to prior note regarding Neurology's recommendations as well as psychiatry's recommendations. Palliative on board and recommending after discussion with family the following:  1 DO NOT RESUSCITATE DO NOT INTUBATE  2. Depakote for dementia with behavior  3. Parkinsonism being exacerbated by the patient not taking her medication  4. We'll place a social work consult for residential hospice placement in the morning  Family is not quite set on full comfort but want to pursue their options. Likely will adopt full comfort approach after speaking with hospice   - at this point per discussion with social worker the patient is not eligible for hospice care as such will transition to skilled nursing facility 2. Mild dehydration -with recent changes in medication as well as progressive Parkinson's dementia patient has had trouble with oral  intake. Will treat supportively at this time 3. Chronic recurrent UTI -discontinued nitrofurantoin initially due to creatinine clearance of 40 4. Hypertension - Continue home medications. Will continue to monitor 5. Right middle finger laceration - tetanus toxoid ordered and placed per EMR 6. Anemia chronic: stable with no active bleeding reported.  Procedures: CT head: no evidence of acute intracranial disease.  Consultations:  Neurology and palliative care  Discharge Exam: Filed Vitals:   03/16/13 1412  BP: 117/48  Pulse: 105  Temp: 98.2 F (36.8 C)  Resp: 18    General: Pt in NAd, Awake, sitting up in bed Cardiovascular: RRR, no MRG Respiratory: CTA Bl, no wheezes  Discharge Instructions  Discharge Orders   Future Appointments Provider Department Dept Phone   07/06/2013 2:30 PM York Spaniel, MD [facility] [phone]   Future Orders Complete By Expires   Call MD for:  extreme fatigue  As directed    Call MD for:  redness, tenderness, or signs of infection (pain, swelling, redness, odor or green/yellow discharge around incision site)  As directed    Call MD for:  temperature >100.4  As directed    Call MD for:  temperature >100.4  As directed    Diet - low sodium heart healthy  As directed    Diet - low sodium heart healthy  As directed    Discharge instructions  As directed    Comments:     Please have patient evaluated and if appropriate placed into hospice care.   Increase activity slowly  As directed    Increase activity slowly  As directed        Medication List    STOP taking  these medications       ALPRAZolam 1 MG tablet  Commonly known as:  XANAX     amantadine 100 MG capsule  Commonly known as:  SYMMETREL     diphenhydrAMINE 25 MG tablet  Commonly known as:  BENADRYL     meloxicam 15 MG tablet  Commonly known as:  MOBIC     nitrofurantoin 50 MG capsule  Commonly known as:  MACRODANTIN      sulfamethoxazole-trimethoprim 800-160 MG per tablet  Commonly known as:  BACTRIM DS,SEPTRA DS      TAKE these medications       acetaminophen 500 MG tablet  Commonly known as:  TYLENOL  Take 500-1,000 mg by mouth every 6 (six) hours as needed for pain.     amLODipine 2.5 MG tablet  Commonly known as:  NORVASC  Take 2.5 mg by mouth daily.     carbidopa-levodopa-entacapone 31.25-125-200 MG per tablet  Commonly known as:  STALEVO  Take 0.5-1 tablets by mouth 3 (three) times daily. Pt takes 1 tab in the am and 1/2 tab at noon and 4pm     cloNIDine 0.1 MG tablet  Commonly known as:  CATAPRES  Take 0.1 mg by mouth daily.     divalproex 250 MG DR tablet  Commonly known as:  DEPAKOTE  Take 1 tablet (250 mg total) by mouth every 12 (twelve) hours.     ferrous sulfate 325 (65 FE) MG EC tablet  Take 325 mg by mouth 2 (two) times daily.     metoprolol tartrate 25 MG tablet  Commonly known as:  LOPRESSOR  Take 25 mg by mouth 2 (two) times daily.     omeprazole 20 MG capsule  Commonly known as:  PRILOSEC  Take 20 mg by mouth daily.     polyethylene glycol packet  Commonly known as:  MIRALAX / GLYCOLAX  Take 17 g by mouth daily.       Allergies  Allergen Reactions  . Estrogens Hives and Swelling  . Tramadol Other (See Comments)    Unsure of reaction per patient  . Xanax [Alprazolam]     Worsening confusion  . Hydromorphone Rash  . Neurontin [Gabapentin] Other (See Comments)    Urinary problems  . Olanzapine Itching and Rash      The results of significant diagnostics from this hospitalization (including imaging, microbiology, ancillary and laboratory) are listed below for reference.    Significant Diagnostic Studies: Dg Chest 2 View  03/08/2013   CLINICAL DATA:  Hallucination and altered mental status.  EXAM: CHEST  2 VIEW  COMPARISON:  10/17/2011  FINDINGS: Limited lateral projection due to patient positioning.  Mild streaky lower lung opacities, likely scarring or  atelectasis given morphology. No frank consolidation or edema. No effusion or pneumothorax. Normal heart size.  Diffuse degenerative endplate spurring.  IMPRESSION: Probable mild atelectasis at the bases. No edema or consolidationsuspected .   Electronically Signed   By: Tiburcio Pea M.D.   On: 03/08/2013 23:18   Ct Head Wo Contrast  03/08/2013   CLINICAL DATA:  Hallucinations with loss of consciousness  EXAM: CT HEAD WITHOUT CONTRAST  TECHNIQUE: Contiguous axial images were obtained from the base of the skull through the vertex without intravenous contrast.  COMPARISON:  02/01/2013  FINDINGS: Motion degraded examination, mild/moderate.  Skull:No acute osseous abnormality. No lytic or blastic lesion.  Orbits: No acute abnormality.  Brain: No evidence of acute abnormality, such as acute infarction, hemorrhage, hydrocephalus, or mass lesion/mass effect.  Similar pattern of extensive, confluent bilateral cerebral white matter low density. Mild cerebral volume loss. Extensive intracranial atherosclerotic calcification.  IMPRESSION: 1. No evidence of acute intracranial disease. 2. Advanced chronic small vessel ischemia.   Electronically Signed   By: Tiburcio Pea M.D.   On: 03/08/2013 22:58    Microbiology: Recent Results (from the past 240 hour(s))  URINE CULTURE     Status: None   Collection Time    03/08/13 10:55 PM      Result Value Range Status   Specimen Description URINE, CATHETERIZED   Final   Special Requests NONE   Final   Culture  Setup Time     Final   Value: 03/09/2013 07:38     Performed at [facility] Count     Final   Value: NO GROWTH     Performed at [facility]   Culture     Final   Value: NO GROWTH     Performed at [facility]   Report Status 03/10/2013 FINAL   Final     Labs: Basic Metabolic Panel:  Recent Labs Lab 03/11/13 0545 03/16/13 0518  NA 145  --   K 3.8  --   CL 111  --   CO2 19  --   GLUCOSE 147*  --   BUN 27*  --    CREATININE 0.88 0.94  CALCIUM 9.0  --    Liver Function Tests: No results found for this basename: AST, ALT, ALKPHOS, BILITOT, PROT, ALBUMIN,  in the last 168 hours No results found for this basename: LIPASE, AMYLASE,  in the last 168 hours No results found for this basename: AMMONIA,  in the last 168 hours CBC: No results found for this basename: WBC, NEUTROABS, HGB, HCT, MCV, PLT,  in the last 168 hours Cardiac Enzymes: No results found for this basename: CKTOTAL, CKMB, CKMBINDEX, TROPONINI,  in the last 168 hours BNP: BNP (last 3 results) No results found for this basename: PROBNP,  in the last 8760 hours CBG: No results found for this basename: GLUCAP,  in the last 168 hours     Signed:  [name]  [facility] 03/16/2013, 2:15 PM

## 2013-03-22 ENCOUNTER — Telehealth: Payer: Self-pay | Admitting: Neurology

## 2013-03-22 MED ORDER — DIVALPROEX SODIUM 250 MG PO DR TAB: 250.0000 mg | DELAYED_RELEASE_TABLET | Freq: Two times a day (BID) | ORAL | Status: DC

## 2013-03-22 MED ORDER — CARBIDOPA-LEVODOPA 25-100 MG PO TABS: 1.0000 | ORAL_TABLET | Freq: Three times a day (TID) | ORAL | Status: DC

## 2013-03-22 MED ORDER — ENTACAPONE 200 MG PO TABS: 200.0000 mg | ORAL_TABLET | Freq: Three times a day (TID) | ORAL | Status: DC

## 2013-03-22 NOTE — Telephone Encounter (Signed)
Patient's husband is requesting a refill on carbidopa-levodopa, entacapone separate tablets, she is doing pretty well and he wishes to take her home. The nursing home is wanting to stop giving patient the depacote.

## 2013-03-22 NOTE — Telephone Encounter (Signed)
I called the husband. The patient will be taken home soon. I called in a prescription for Depakote taking 250 mg twice daily, change from Stalevo to the Sinemet 25/100 mg tablets, one tablet 3 times daily. The Comtan will be given separately, taking 200 mg 3 times daily with the Sinemet.

## 2013-06-28 IMAGING — CR DG RIBS 2V*L*
5 series · 5 of 5 positions shown · non-contrast
Comparison: Chest radiographs 08/14/2010.

CLINICAL DATA: Left anterior chest pain.  Shortness of breath.  No
known injury.

LEFT RIBS - 2 VIEW

[w ribs ap upper left]
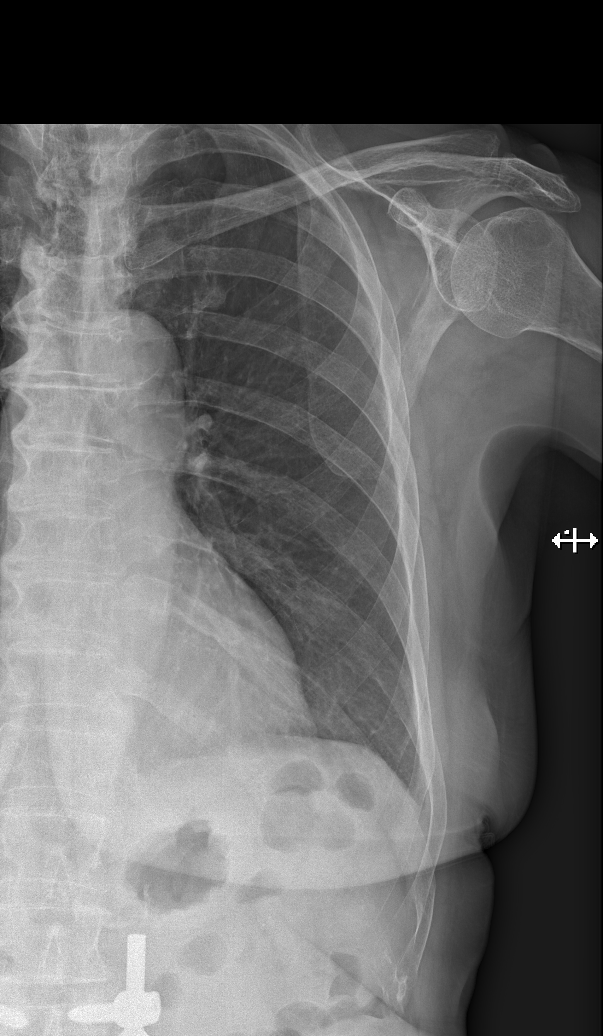

[w chest pa]
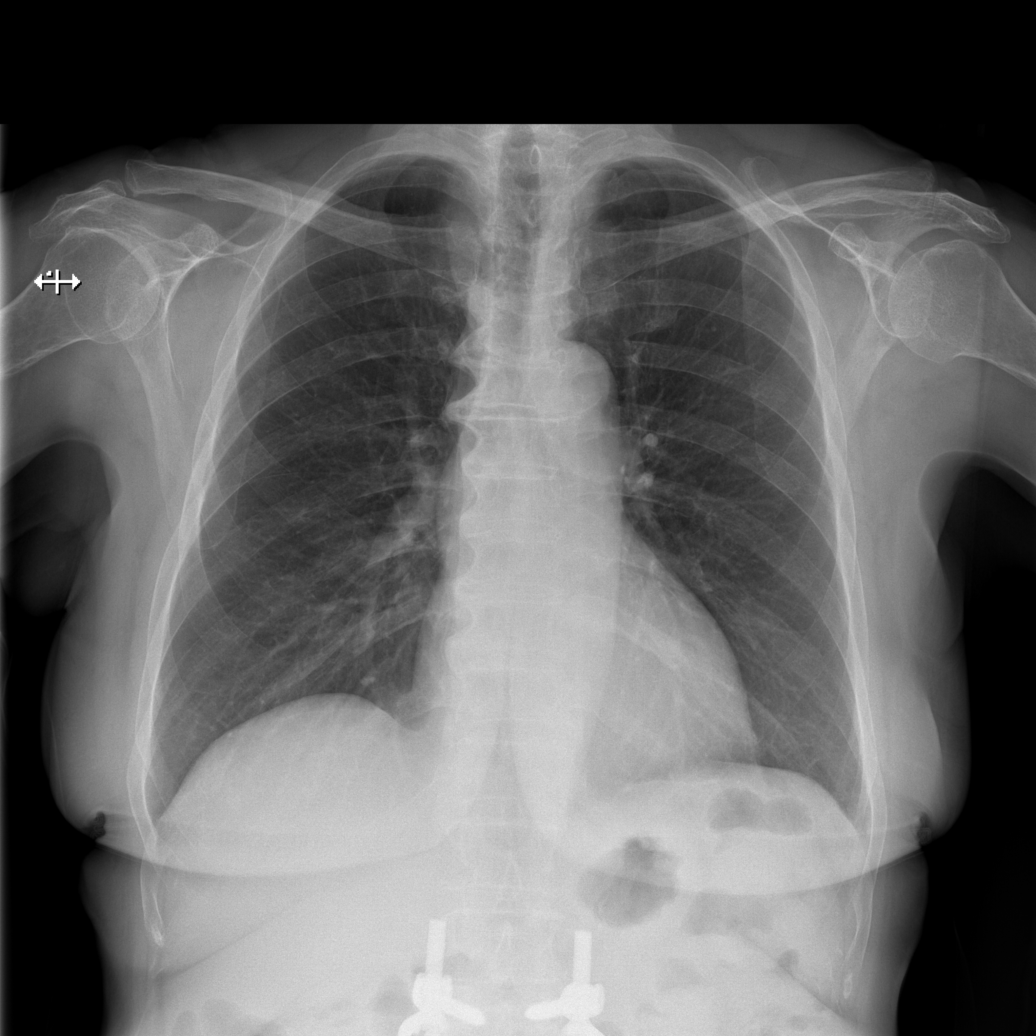

[w ribs ap lower left]
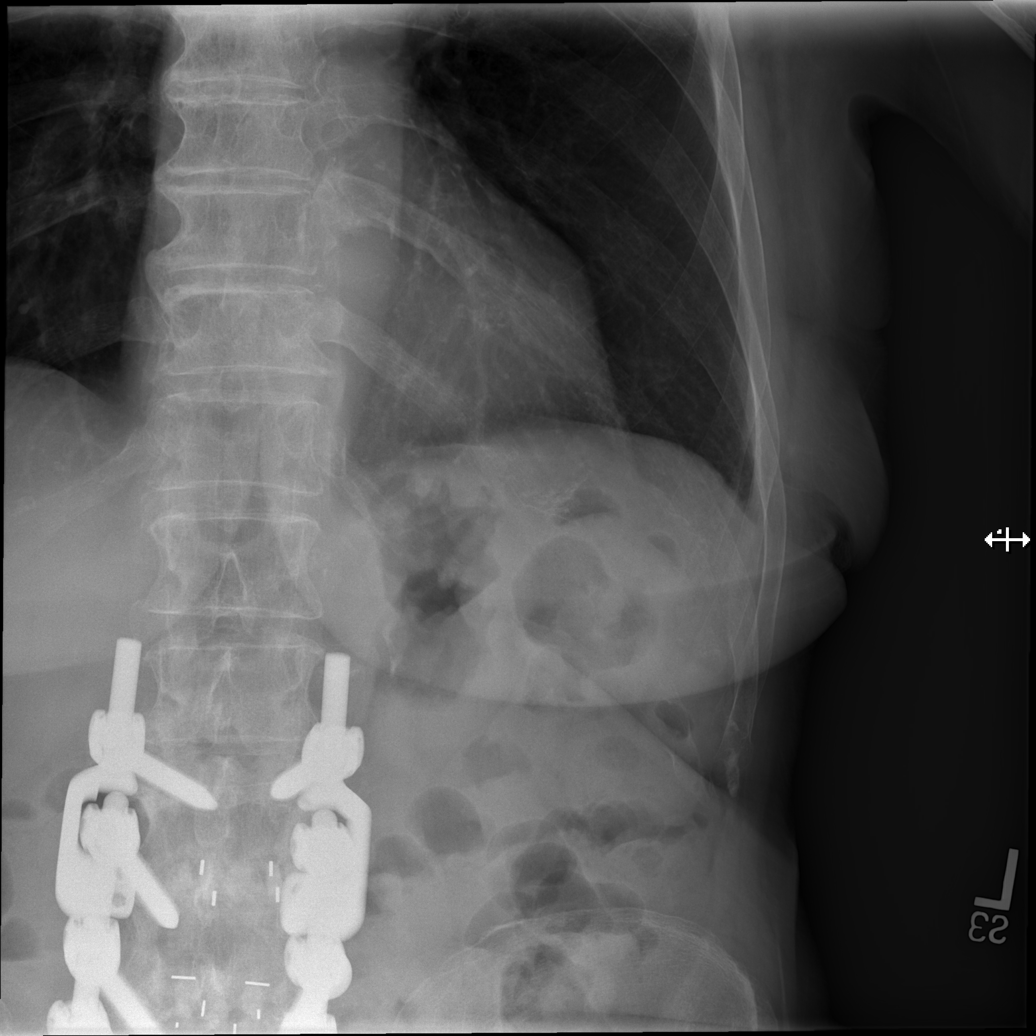

[w ribs obl left (1 of 2)]
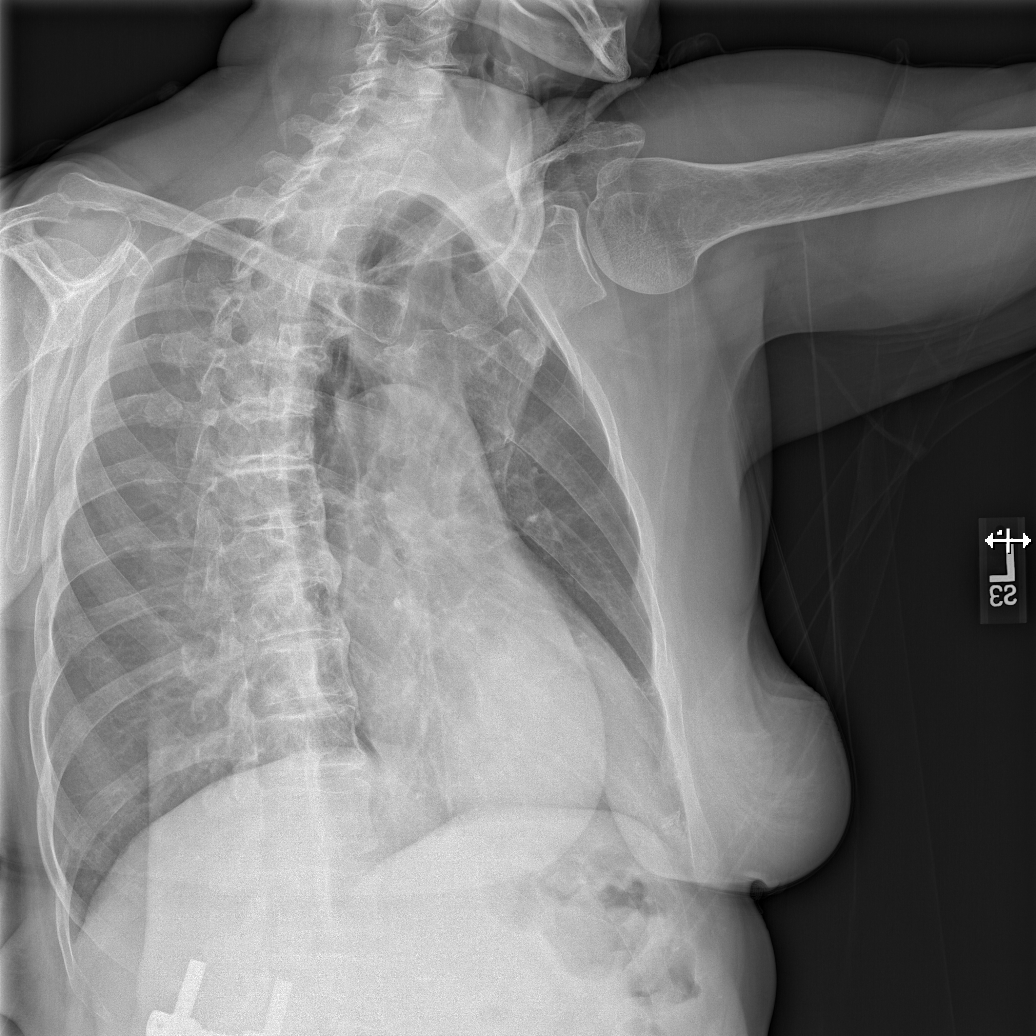

[w ribs obl left (2 of 2)]
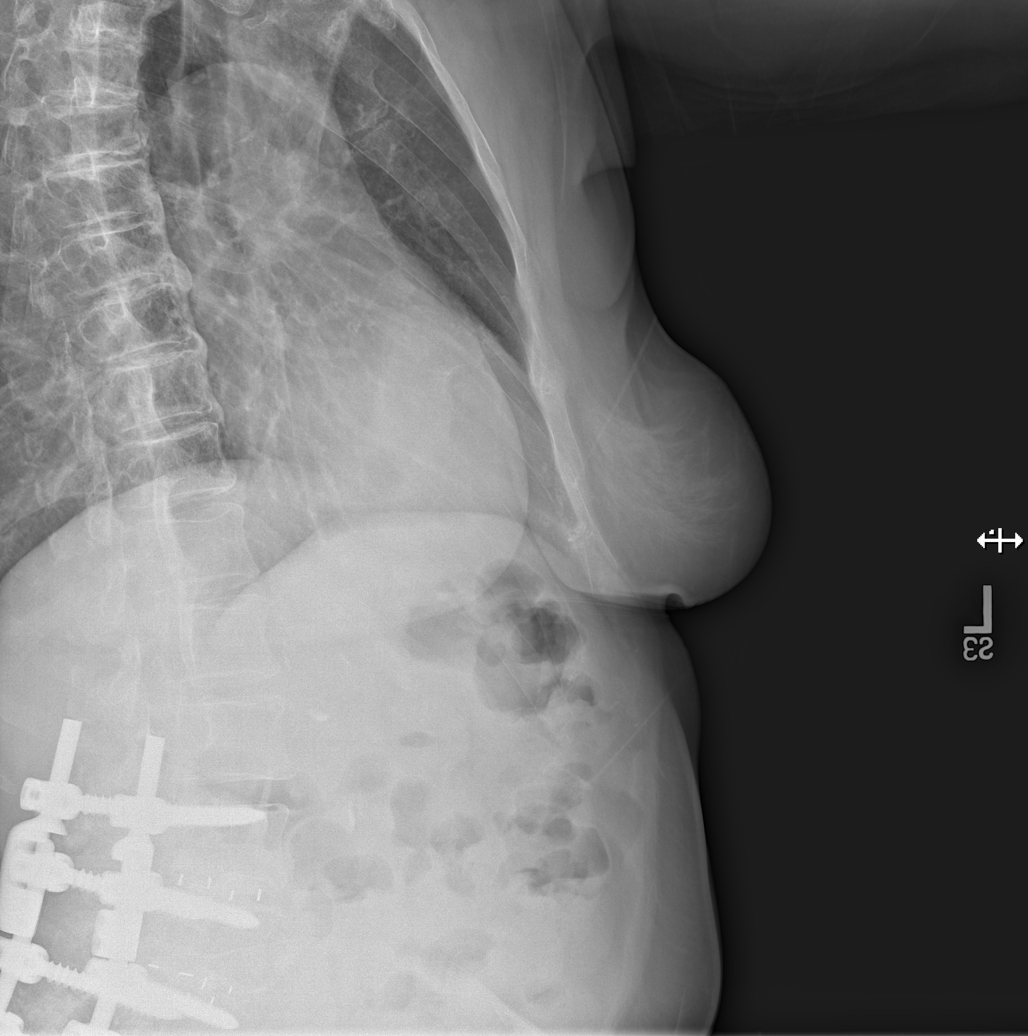

[5 of 5 positions shown; findings below may reference images not displayed]

FINDINGS: The heart size and mediastinal contours are stable.  The
lungs are clear.  There is no pleural effusion or pneumothorax.

There is generalized osteopenia.  No acute left-sided rib fracture
or focal rib lesion is identified.  Diffuse thoracic spine
degenerative changes are noted.  The patient is status post lower
lumbar fusion.
IMPRESSION: No evidence of acute left-sided rib fracture or active
cardiopulmonary process.

## 2013-07-06 ENCOUNTER — Telehealth: Payer: Self-pay | Admitting: Neurology

## 2013-07-06 ENCOUNTER — Ambulatory Visit: Payer: Medicare Other | Admitting: Neurology

## 2013-07-06 NOTE — Telephone Encounter (Signed)
This patient did not show for the revisit appointment today. 

## 2013-09-23 ENCOUNTER — Encounter: Payer: Self-pay | Admitting: Neurology

## 2013-09-23 ENCOUNTER — Ambulatory Visit (INDEPENDENT_AMBULATORY_CARE_PROVIDER_SITE_OTHER): Payer: Medicare Other | Admitting: Neurology

## 2013-09-23 VITALS — BP 122/84 | HR 60

## 2013-09-23 DIAGNOSIS — G20 Parkinson's disease: Secondary | ICD-10-CM | POA: Diagnosis not present

## 2013-09-23 DIAGNOSIS — R269 Unspecified abnormalities of gait and mobility: Secondary | ICD-10-CM

## 2013-09-23 NOTE — Progress Notes (Signed)
Reason for visit: Parkinson's disease  [name] is an 68 y.o. female  History of present illness:  [name] is a 68 year old right-handed white female with a history of Parkinson's disease. The patient was last seen when she was in the hospital on 03/16/2013. The patient had a delirium state at that time, and she remained bedridden, minimally responsive. The patient went to an extended care facility where she continued to worsen. The husband indicates that she developed pressure sores, and her mental status continues to decline. He took the patient home, and began caring for her at home, and she subsequently improved dramatically. She regained the ability to walk short distances with assistance. She has not had any recent falls. Within the last several weeks, her functional ability has declined somewhat. She has had some troubles with choking with swallowing, but she is eating soft foods, Ensure. Her memory is relatively good according to the husband. There have been no episodes of confusion or hallucinations. She has been on prochlorperazine taking 10 mg daily, and she is on Sinemet taking the 25/250 mg tablets 3 times daily. The husband believes that her ability to ambulate has declined recently. She comes back to this office for an evaluation.  Past Medical History  Diagnosis Date  . Spondylolisthesis   . Lumbar stenosis   . Depression   . Parkinson disease   . Asthma   . Hemangioma     left posterior  . Hypertension   . CVA (cerebrovascular accident)     pts lawyer stold her she had a stroke  . Sleep apnea     does not use a cpap machine  . Blood transfusion 1976  . Chronic UTI   . Hepatitis 1973    hepatitis A ?  Marland Kitchen GERD (gastroesophageal reflux disease)   . Anxiety   . Lumbar radiculopathy     parkinsons  . Memory disorder   . Gait disorder   . Obstructive sleep apnea   . History of seizures   . Gout   . Organic heart disease, NYHA class 1   . History of frequent  urinary tract infections   . Chronic back pain   . Hearing deficit     Past Surgical History  Procedure Laterality Date  . Laminectomy    . Cardiac catheterization  2003     Angiographically patent coronary arteries with luminal irregularities in mid left anterior descending coronary artery and mid right coronary  artery. Normal left ventricular systolic function, ejection fraction estimate of  60 to 65% Normal abdominal aorta.   Normal renal arteries.  . Abdominal hysterectomy    . Foot surgery    . Anal fissure surgery    . Appendectomy    . Back surgery    . Lumbar fusion  10/23/2011    Family History  Problem Relation Age of Onset  . Coronary artery disease    . Anesthesia problems Neg Hx   . Cancer Mother   . Kidney cancer Mother   . Heart disease Mother   . Depression Mother   . Heart disease Father   . Brain cancer Sister   . Depression Sister   . Diabetes Brother   . Diabetes Sister   . Depression Other   . Depression Son     Social history:  reports that she has quit smoking. She has never used smokeless tobacco. She reports that she does not drink alcohol or use illicit drugs.  Allergies  Allergen Reactions  . Estrogens Hives and Swelling  . Tramadol Other (See Comments)    Unsure of reaction per patient  . Xanax [Alprazolam]     Worsening confusion  . Hydromorphone Rash  . Neurontin [Gabapentin] Other (See Comments)    Urinary problems  . Olanzapine Itching and Rash    Medications:  Current Outpatient Prescriptions on File Prior to Visit  Medication Sig Dispense Refill  . acetaminophen (TYLENOL) 500 MG tablet Take 500-1,000 mg by mouth every 6 (six) hours as needed for pain.      Marland Kitchen amLODipine (NORVASC) 2.5 MG tablet Take 2.5 mg by mouth daily.      . cloNIDine (CATAPRES) 0.1 MG tablet Take 0.1 mg by mouth 3 (three) times daily.       . ferrous sulfate 325 (65 FE) MG EC tablet Take 325 mg by mouth 2 (two) times daily.      . metoprolol tartrate  (LOPRESSOR) 25 MG tablet Take 25 mg by mouth 2 (two) times daily.      Marland Kitchen omeprazole (PRILOSEC) 20 MG capsule Take 20 mg by mouth daily.      . polyethylene glycol (MIRALAX / GLYCOLAX) packet Take 17 g by mouth daily.      . [DISCONTINUED] gabapentin (NEURONTIN) 800 MG tablet Take 800 mg by mouth 3 (three) times daily.        . [DISCONTINUED] Potassium (POTASSIMIN PO) Take 1 tablet by mouth daily.        No current facility-administered medications on file prior to visit.    ROS:  Out of a complete 14 system review of symptoms, the patient complains only of the following symptoms, and all other reviewed systems are negative.  Gait disorder Memory problems   Blood pressure 122/84, pulse 60, weight 0 lb (0 kg).  Physical Exam  General: The patient is alert and cooperative at the time of the examination.  Neuromuscular: The patient has incomplete abduction of the right arm, able to elevate arm to 45. Full abduction is noted on the left side.  Skin: No significant peripheral edema is noted.   Neurologic Exam  Mental status: The Mini-Mental status examination done today shows a total score 21/30.  Cranial nerves: Facial symmetry is present. The speech is slightly hypophonic. Extraocular movements are full. Visual fields are full. Masking of the face is seen.  Motor: The patient has good strength in all 4 extremities.  Sensory examination: Soft touch sensation is symmetric on the face, arms, and legs.  Coordination: The patient has good finger-nose-finger and heel-to-shin bilaterally.  Gait and station: The patient requires assistance with standing. Once up, she has a tendency to lean backwards. She can walk short distances with assistance only.  Reflexes: Deep tendon reflexes are symmetric.   Assessment/Plan:  One. Parkinson's disease  2. Memory disturbance  3. Gait disturbance  The patient has made good gains since she was in the hospital in October of 2014. Her  delirium state has resolved, but she does have a residual memory disorder. The patient is off of Stalevo, currently on the Sinemet 25/250 mg tablets. The patient has been placed on prochlorperazine which may worsen Parkinson's disease. The patient will come off of this medication, and remain on the current dose of the Sinemet. We will followup in about 3 months. Some adjustments to the Sinemet dosing may be made at that time. We will get home health physical therapy out to the house to help with her mobility.  Jill Alexanders MD 09/23/2013 9:20 PM  [facility] [address] [place], [place] [address]  Phone [phone] Fax [phone]

## 2013-09-23 NOTE — Patient Instructions (Signed)

## 2013-09-24 ENCOUNTER — Telehealth: Payer: Self-pay | Admitting: *Deleted

## 2013-09-24 NOTE — Telephone Encounter (Signed)
Noted. Okay to use hospice physical therapy.

## 2013-09-24 NOTE — Telephone Encounter (Signed)
Glenda calling from hospice of [facility].  She informed that she heard [facility] was coming out for HHPT.  Since under hospice, they need to use the hospice PT vs outside.  Cancelled PT with [facility](spoke to [employer]).   Glenda with Hospice stated if any other care needs to contact them at [phone].    PT will be administered by Hospice.

## 2013-10-11 ENCOUNTER — Telehealth: Payer: Self-pay | Admitting: Neurology

## 2013-10-11 NOTE — Telephone Encounter (Signed)
Pt's husband Jenny Reichmann called would like for Dr. Jannifer Franklin to call concerning pt's legs, hands and feet. [name] would like to know what Dr. Jannifer Franklin thoughts are on increasing the medication carbidopa-levodopa (SINEMET IR) 25-250 MG per tablet. Please call pt's husband back concerning this matter. Thanks

## 2013-10-11 NOTE — Telephone Encounter (Signed)
I called the patient, talked with the husband. The patient has had some pain in the feet that seemed to come on around 12 noon, worse as the day goes on. He believes that the Sinemet makes the discomfort worse, if this is restless legs, it should make the symptoms better. The patient will be given a trial on half of a Sinemet tablet at noon, if this makes a difference, we may cut back further on the medication. The patient is off of prochlorperazine.

## 2013-10-12 ENCOUNTER — Telehealth: Payer: Self-pay | Admitting: Neurology

## 2013-10-12 NOTE — Telephone Encounter (Signed)
I called the husband. I also called the hospice nurse. The patient will be cut back some on the Sinemet, but this does not improve the discomfort in the legs, gabapentin can be added in low dose taking 100 mg 3 times daily. The husband will contact me about this.

## 2013-10-12 NOTE — Telephone Encounter (Signed)
[name] with Hospice calling per Dr. Hortense Ramal, pt experiencing lower extremity numbness and tingling.  Questioning if patient can begin taking Neurontin.  Please call and advise

## 2013-10-14 ENCOUNTER — Telehealth: Payer: Self-pay | Admitting: Neurology

## 2013-10-14 NOTE — Telephone Encounter (Signed)
I called hospice. The patient is having dysesthesias in the legs. Okay to start gabapentin 100 mg 3 times daily.

## 2013-10-14 NOTE — Telephone Encounter (Signed)
Elmyra Ricks from [facility] calling to get more information regarding patient's Gabapentin, she wants to know if patient is going to go back to taking the medication. Please call her and advise.

## 2014-01-25 ENCOUNTER — Ambulatory Visit: Payer: Medicare HMO | Admitting: Neurology

## 2014-02-24 DEATH — deceased
